# Patient Record
Sex: Female | Born: 1970 | Race: White | Hispanic: No | Marital: Married | Smoking: Never smoker
Health system: Southern US, Community
[De-identification: ages and names within clinical notes are randomized; demographics above are authoritative.]

## PROBLEM LIST (undated history)

## (undated) DIAGNOSIS — Z87442 Personal history of urinary calculi: Secondary | ICD-10-CM

## (undated) DIAGNOSIS — R519 Headache, unspecified: Secondary | ICD-10-CM

## (undated) DIAGNOSIS — R112 Nausea with vomiting, unspecified: Secondary | ICD-10-CM

## (undated) DIAGNOSIS — A630 Anogenital (venereal) warts: Secondary | ICD-10-CM

## (undated) DIAGNOSIS — F32A Depression, unspecified: Secondary | ICD-10-CM

## (undated) DIAGNOSIS — R51 Headache: Secondary | ICD-10-CM

## (undated) DIAGNOSIS — B019 Varicella without complication: Secondary | ICD-10-CM

## (undated) DIAGNOSIS — T753XXA Motion sickness, initial encounter: Secondary | ICD-10-CM

## (undated) DIAGNOSIS — Z9889 Other specified postprocedural states: Secondary | ICD-10-CM

## (undated) DIAGNOSIS — E785 Hyperlipidemia, unspecified: Secondary | ICD-10-CM

## (undated) DIAGNOSIS — F329 Major depressive disorder, single episode, unspecified: Secondary | ICD-10-CM

## (undated) DIAGNOSIS — I10 Essential (primary) hypertension: Secondary | ICD-10-CM

## (undated) HISTORY — DX: Varicella without complication: B01.9

## (undated) HISTORY — DX: Hyperlipidemia, unspecified: E78.5

## (undated) HISTORY — DX: Depression, unspecified: F32.A

## (undated) HISTORY — DX: Personal history of urinary calculi: Z87.442

## (undated) HISTORY — DX: Headache: R51

## (undated) HISTORY — DX: Major depressive disorder, single episode, unspecified: F32.9

## (undated) HISTORY — DX: Anogenital (venereal) warts: A63.0

## (undated) HISTORY — DX: Headache, unspecified: R51.9

---
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
## 2008-12-12 HISTORY — PX: HYSTEROSCOPY: SHX211

## 2008-12-12 HISTORY — PX: CYSTOSCOPY: SHX5120

## 2012-03-18 DIAGNOSIS — E559 Vitamin D deficiency, unspecified: Secondary | ICD-10-CM | POA: Insufficient documentation

## 2012-06-01 DIAGNOSIS — M67431 Ganglion, right wrist: Secondary | ICD-10-CM | POA: Insufficient documentation

## 2013-04-22 DIAGNOSIS — M25569 Pain in unspecified knee: Secondary | ICD-10-CM | POA: Insufficient documentation

## 2013-04-22 DIAGNOSIS — E669 Obesity, unspecified: Secondary | ICD-10-CM | POA: Insufficient documentation

## 2013-04-22 DIAGNOSIS — E66811 Obesity, class 1: Secondary | ICD-10-CM | POA: Insufficient documentation

## 2015-07-29 ENCOUNTER — Ambulatory Visit (INDEPENDENT_AMBULATORY_CARE_PROVIDER_SITE_OTHER): Payer: Managed Care, Other (non HMO) | Admitting: Internal Medicine

## 2015-07-29 ENCOUNTER — Encounter: Payer: Self-pay | Admitting: Internal Medicine

## 2015-07-29 VITALS — BP 124/90 | HR 73 | Temp 98.4°F | Ht 65.33 in | Wt 203.0 lb

## 2015-07-29 DIAGNOSIS — R519 Headache, unspecified: Secondary | ICD-10-CM | POA: Insufficient documentation

## 2015-07-29 DIAGNOSIS — R51 Headache: Secondary | ICD-10-CM

## 2015-07-29 DIAGNOSIS — F331 Major depressive disorder, recurrent, moderate: Secondary | ICD-10-CM | POA: Insufficient documentation

## 2015-07-29 DIAGNOSIS — E785 Hyperlipidemia, unspecified: Secondary | ICD-10-CM | POA: Diagnosis not present

## 2015-07-29 DIAGNOSIS — F329 Major depressive disorder, single episode, unspecified: Secondary | ICD-10-CM

## 2015-07-29 DIAGNOSIS — F32A Depression, unspecified: Secondary | ICD-10-CM

## 2015-07-29 DIAGNOSIS — F419 Anxiety disorder, unspecified: Secondary | ICD-10-CM

## 2015-07-29 NOTE — Progress Notes (Signed)
HPI  Pt presents to the clinic today to establish care and for management of the conditions listed below. She is transferring care from [facility].  Flu: 2015 Tetanus: > 10 years ago LMP: 07/10/15 Pap Smear: 08/2011 Mammogram: 12/10/2014 Vision Screening: yearly Dentist: biannually  Depression: This is a chronic issue for her. She takes Wellbutrin and Lexapro. She does have a lot of stress going on in her life right now. She denies SI/HI.  Frequent Headache: She thinks it may be sinus related. She is having headaches 5 x week right now. The occur behind her eyes and on her forehead. She describes the pain as squeezing and pressure. She does have some sensitivity to light but not sound. She denies nausea and vomiting. She gets good relief with Sudafed and Ibuprofen.  HLD: She reports she was on a statin but reports she can not rememer the name of it. She does not consume a low fat.  Past Medical History  Diagnosis Date  . Chicken pox   . Depression   . Frequent headaches   . Genital warts   . Hyperlipidemia   . History of kidney stones     Current Outpatient Prescriptions  Medication Sig Dispense Refill  . buPROPion (WELLBUTRIN XL) 150 MG 24 hr tablet Take 150 mg by mouth daily.     . Cholecalciferol (VITAMIN D3) 2000 UNITS TABS Take 1 tablet by mouth daily.    [name] Kitchen escitalopram (LEXAPRO) 20 MG tablet Take 20 mg by mouth daily.      No current facility-administered medications for this visit.    No Known Allergies  Family History  Problem Relation Age of Onset  . Hyperlipidemia Mother   . Heart disease Mother   . Hypertension Mother   . Alzheimer's disease Mother   . COPD Mother   . Hyperlipidemia Father   . Hypertension Father   . Breast cancer Paternal Aunt   . Arthritis Maternal Grandmother   . Mitochondrial disorder Brother     Mitochondrial encephalomyopathy lactic acidosis    Social History   Social History  . Marital Status: Married    Spouse Name:  N/A  . Number of Children: N/A  . Years of Education: N/A   Occupational History  . Not on file.   Social History Main Topics  . Smoking status: Never Smoker   . Smokeless tobacco: Never Used  . Alcohol Use: 0.0 oz/week    0 Standard drinks or equivalent per week     Comment: occasional  . Drug Use: No  . Sexual Activity: Not on file   Other Topics Concern  . Not on file   Social History Narrative  . No narrative on file    ROS:  Constitutional: Pt reports fatigue. Denies fever, malaise, headache or abrupt weight changes.  HEENT: Denies eye pain, eye redness, ear pain, ringing in the ears, wax buildup, runny nose, nasal congestion, bloody nose, or sore throat. Respiratory: Denies difficulty breathing, shortness of breath, cough or sputum production.   Cardiovascular: Denies chest pain, chest tightness, palpitations or swelling in the hands or feet.  Skin: Denies redness, rashes, lesions or ulcercations.  Neurological: Denies dizziness, difficulty with memory, difficulty with speech or problems with balance and coordination.  Psych: Pt reports depression. Denies anxiety SI/HI.  No other specific complaints in a complete review of systems (except as listed in HPI above).  PE:  BP 124/90 mmHg  Pulse 73  Temp(Src) 98.4 F (36.9 C) (Oral)  Ht  5' 5.33" (1.659 m)  Wt 203 lb (92.08 kg)  BMI 33.46 kg/m2  SpO2 98%  LMP 07/10/2015   Wt Readings from Last 3 Encounters:  07/29/15 203 lb (92.08 kg)    General: Appears her stated age, obese in NAD. HEENT: Head: normal shape and size; Eyes: sclera white, no icterus, conjunctiva pink, PERRLA and EOMs intact; Ears: Tm's gray and intact, normal light reflex; Nose: mucosa pink and moist, septum midline; Throat/Mouth: Teeth present, mucosa pink and moist, no lesions or ulcerations noted.  Cardiovascular: Normal rate and rhythm. S1,S2 noted.  No murmur, rubs or gallops noted. Pulmonary/Chest: Normal effort and positive vesicular  breath sounds. No respiratory distress. No wheezes, rales or ronchi noted.  Neurological: Alert and oriented.  Psychiatric: Affect flat. She is tearful at times. She does make eye contact and engage though.    Assessment and Plan:  Make an appt on your way out for an annual exam

## 2015-07-29 NOTE — Assessment & Plan Note (Signed)
We will check CMET and Lipid Profile with her physical exam next week Encouraged her to consume a low fat diet

## 2015-07-29 NOTE — Patient Instructions (Signed)

## 2015-07-29 NOTE — Progress Notes (Signed)
Pre visit review using our clinic review tool, if applicable. No additional management support is needed unless otherwise documented below in the visit note. 

## 2015-07-29 NOTE — Assessment & Plan Note (Signed)
Sinus related Discussed trying Zyrtec daily

## 2015-07-29 NOTE — Assessment & Plan Note (Signed)
Chronic but stable on Wellbutrin and Lexapro Discussed having her see a therapist and this is something she is interested in but she wants to check with her insurance company first.

## 2015-07-30 ENCOUNTER — Other Ambulatory Visit (INDEPENDENT_AMBULATORY_CARE_PROVIDER_SITE_OTHER): Payer: Managed Care, Other (non HMO)

## 2015-07-30 ENCOUNTER — Other Ambulatory Visit: Payer: Self-pay | Admitting: Internal Medicine

## 2015-07-30 DIAGNOSIS — R7989 Other specified abnormal findings of blood chemistry: Secondary | ICD-10-CM | POA: Diagnosis not present

## 2015-07-30 DIAGNOSIS — Z0000 Encounter for general adult medical examination without abnormal findings: Secondary | ICD-10-CM | POA: Diagnosis not present

## 2015-07-30 LAB — LIPID PANEL
CHOLESTEROL: 224 mg/dL — AB (ref 0–200)
HDL: 39 mg/dL — AB (ref >39.00)
NonHDL: 185.49
TRIGLYCERIDES: 239 mg/dL — AB (ref 0.0–149.0)
Total CHOL/HDL Ratio: 6
VLDL: 47.8 mg/dL — AB (ref 0.0–40.0)

## 2015-07-30 LAB — COMPREHENSIVE METABOLIC PANEL
ALBUMIN: 4.2 g/dL (ref 3.5–5.2)
ALK PHOS: 96 U/L (ref 39–117)
ALT: 15 U/L (ref 0–35)
AST: 15 U/L (ref 0–37)
BUN: 17 mg/dL (ref 6–23)
CO2: 29 mEq/L (ref 19–32)
Calcium: 9.2 mg/dL (ref 8.4–10.5)
Chloride: 104 mEq/L (ref 96–112)
Creatinine, Ser: 0.8 mg/dL (ref 0.40–1.20)
GFR: 82.5 mL/min (ref >60.00)
Glucose, Bld: 87 mg/dL (ref 70–99)
POTASSIUM: 4.5 meq/L (ref 3.5–5.1)
Sodium: 139 mEq/L (ref 135–145)
TOTAL PROTEIN: 6.8 g/dL (ref 6.0–8.3)
Total Bilirubin: 0.5 mg/dL (ref 0.2–1.2)

## 2015-07-30 LAB — CBC
HEMATOCRIT: 38.7 % (ref 36.0–46.0)
HEMOGLOBIN: 13.1 g/dL (ref 12.0–15.0)
MCHC: 33.7 g/dL (ref 30.0–36.0)
MCV: 94.3 fl (ref 78.0–100.0)
PLATELETS: 280 10*3/uL (ref 150.0–400.0)
RBC: 4.1 Mil/uL (ref 3.87–5.11)
RDW: 13.3 % (ref 11.5–15.5)
WBC: 7 10*3/uL (ref 4.0–10.5)

## 2015-07-30 LAB — LDL CHOLESTEROL, DIRECT: Direct LDL: 137 mg/dL

## 2015-07-30 LAB — HEMOGLOBIN A1C: HEMOGLOBIN A1C: 4.7 % (ref 4.6–6.5)

## 2015-08-05 ENCOUNTER — Encounter: Payer: Self-pay | Admitting: Internal Medicine

## 2015-08-05 ENCOUNTER — Ambulatory Visit (INDEPENDENT_AMBULATORY_CARE_PROVIDER_SITE_OTHER): Payer: Managed Care, Other (non HMO) | Admitting: Internal Medicine

## 2015-08-05 VITALS — BP 120/74 | HR 76 | Temp 98.2°F | Ht 65.33 in | Wt 201.0 lb

## 2015-08-05 DIAGNOSIS — Z0000 Encounter for general adult medical examination without abnormal findings: Secondary | ICD-10-CM | POA: Diagnosis not present

## 2015-08-05 DIAGNOSIS — Z111 Encounter for screening for respiratory tuberculosis: Secondary | ICD-10-CM

## 2015-08-05 NOTE — Addendum Note (Signed)
Addended by: Lurlean Nanny on: 08/05/2015 02:49 PM   Modules accepted: Orders

## 2015-08-05 NOTE — Patient Instructions (Signed)

## 2015-08-05 NOTE — Progress Notes (Signed)
Subjective:    Patient ID: Teresa Villarreal, female    DOB: 1971/09/11, 44 y.o.   MRN: [id number]  HPI  Pt presents to the clinic today for her annual exam.  Flu: 2015 Tetanus: 10/11/2011 LMP: 07/10/15 Pap Smear: 08/2011, needs one but on her menstrual cycle today Mammogram: 12/10/2014 Vision Screening: yearly Dentist: biannually  Diet: She tries to eat chocolate as much as possible. She does not adhere to any specific diet. Exercise: No exercise  Review of Systems      Past Medical History  Diagnosis Date  . Chicken pox   . Depression   . Frequent headaches   . Genital warts   . Hyperlipidemia   . History of kidney stones     Current Outpatient Prescriptions  Medication Sig Dispense Refill  . buPROPion (WELLBUTRIN XL) 150 MG 24 hr tablet Take 150 mg by mouth daily.     . Cholecalciferol (VITAMIN D3) 2000 UNITS TABS Take 1 tablet by mouth daily.    [name] Kitchen escitalopram (LEXAPRO) 20 MG tablet Take 20 mg by mouth daily.      No current facility-administered medications for this visit.    No Known Allergies  Family History  Problem Relation Age of Onset  . Hyperlipidemia Mother   . Heart disease Mother   . Hypertension Mother   . Alzheimer's disease Mother   . COPD Mother   . Hyperlipidemia Father   . Hypertension Father   . Breast cancer Paternal Aunt   . Arthritis Maternal Grandmother   . Mitochondrial disorder Brother     Mitochondrial encephalomyopathy lactic acidosis    Social History   Social History  . Marital Status: Married    Spouse Name: N/A  . Number of Children: N/A  . Years of Education: N/A   Occupational History  . Not on file.   Social History Main Topics  . Smoking status: Never Smoker   . Smokeless tobacco: Never Used  . Alcohol Use: 0.0 oz/week    0 Standard drinks or equivalent per week     Comment: occasional  . Drug Use: No  . Sexual Activity: Yes   Other Topics Concern  . Not on file   Social History Narrative  . No  narrative on file     Constitutional: Denies fever, malaise, fatigue, or abrupt weight changes.  HEENT: Denies eye pain, eye redness, ear pain, ringing in the ears, wax buildup, runny nose, nasal congestion, bloody nose, or sore throat. Respiratory: Denies difficulty breathing, shortness of breath, cough or sputum production.   Cardiovascular: Denies chest pain, chest tightness, palpitations or swelling in the hands or feet.  Gastrointestinal: Pt reports constipation. Denies abdominal pain, bloating, diarrhea or blood in the stool.  GU: Denies urgency, frequency, pain with urination, burning sensation, blood in urine, odor or discharge. Musculoskeletal: Denies decrease in range of motion, difficulty with gait, muscle pain or joint pain and swelling.  Skin: Denies redness, rashes, lesions or ulcercations.  Neurological: Denies dizziness, difficulty with memory, difficulty with speech or problems with balance and coordination.  Psych: Denies anxiety, depression, SI/HI.  No other specific complaints in a complete review of systems (except as listed in HPI above).  Objective:   Physical Exam  BP 120/74 mmHg  Pulse 76  Temp(Src) 98.2 F (36.8 C) (Oral)  Ht 5' 5.33" (1.659 m)  Wt 201 lb (91.173 kg)  BMI 33.13 kg/m2  SpO2 98%  LMP 08/02/2015 Wt Readings from Last 3 Encounters:  08/05/15 201 lb (  91.173 kg)  07/29/15 203 lb (92.08 kg)    General: Appears her stated age, obese in NAD. Skin: Warm, dry and intact. No rashes, lesions or ulcerations noted. HEENT: Head: normal shape and size; Eyes: sclera white, no icterus, conjunctiva pink, PERRLA and EOMs intact; Ears: Tm's gray and intact, normal light reflex; Throat/Mouth: Teeth present, mucosa pink and moist, no exudate, lesions or ulcerations noted.  Neck: Neck supple, trachea midline. No masses, lumps or thyromegaly present.  Cardiovascular: Normal rate and rhythm. S1,S2 noted.  No murmur, rubs or gallops noted. No JVD or BLE edema. No  carotid bruits noted. Pulmonary/Chest: Normal effort and positive vesicular breath sounds. No respiratory distress. No wheezes, rales or ronchi noted.  Abdomen: Soft and nontender. Normal bowel sounds, no bruits noted. No distention or masses noted. Liver, spleen and kidneys non palpable. Musculoskeletal: Normal range of motion. Strength 5/5 BUE/BLE. No difficulty with gait.  Neurological: Alert and oriented. Cranial nerves II-XII grossly intact. Coordination normal.  Psychiatric: Mood and affect normal. Behavior is normal. Judgment and thought content normal.     BMET    Component Value Date/Time   NA 139 07/30/2015 1516   K 4.5 07/30/2015 1516   CL 104 07/30/2015 1516   CO2 29 07/30/2015 1516   GLUCOSE 87 07/30/2015 1516   BUN 17 07/30/2015 1516   CREATININE 0.80 07/30/2015 1516   CALCIUM 9.2 07/30/2015 1516    Lipid Panel     Component Value Date/Time   CHOL 224* 07/30/2015 1516   TRIG 239.0* 07/30/2015 1516   HDL 39.00* 07/30/2015 1516   CHOLHDL 6 07/30/2015 1516   VLDL 47.8* 07/30/2015 1516    CBC    Component Value Date/Time   WBC 7.0 07/30/2015 1516   RBC 4.10 07/30/2015 1516   HGB 13.1 07/30/2015 1516   HCT 38.7 07/30/2015 1516   PLT 280.0 07/30/2015 1516   MCV 94.3 07/30/2015 1516   MCHC 33.7 07/30/2015 1516   RDW 13.3 07/30/2015 1516    Hgb A1C Lab Results  Component Value Date   HGBA1C 4.7 07/30/2015         Assessment & Plan:   Preventative Health Maintenance:  Encouraged her to work on diet and exercise Advised her to get a flu shot in the fall 2016 CBC, CMET, Lipid and A1C from 1 week ago reviewed- mildly elevated cholesterol- advised her to consume a low fat low cholesterol diet Advised her to make an appt for her pap smear  Mammogram is due 10/2015 Encouraged her to see an eye doctor and dentist at least annually TB test today Form filled out for employer certifying that she had her annual exam  RTC in 6 months to follow up chronic  conditions

## 2015-08-05 NOTE — Progress Notes (Signed)
Pre visit review using our clinic review tool, if applicable. No additional management support is needed unless otherwise documented below in the visit note. 

## 2015-08-07 LAB — TB SKIN TEST: TB Skin Test: NEGATIVE

## 2015-08-26 ENCOUNTER — Encounter: Payer: Self-pay | Admitting: Family Medicine

## 2015-08-26 ENCOUNTER — Ambulatory Visit (INDEPENDENT_AMBULATORY_CARE_PROVIDER_SITE_OTHER): Payer: Managed Care, Other (non HMO) | Admitting: Family Medicine

## 2015-08-26 VITALS — BP 146/84 | HR 84 | Temp 97.6°F | Ht 65.33 in | Wt 205.8 lb

## 2015-08-26 DIAGNOSIS — M7752 Other enthesopathy of left foot: Secondary | ICD-10-CM | POA: Diagnosis not present

## 2015-08-26 MED ORDER — BUPROPION HCL ER (XL) 150 MG PO TB24: 150.0000 mg | ORAL_TABLET | Freq: Every day | ORAL | Status: DC

## 2015-08-26 MED ORDER — DICLOFENAC SODIUM 75 MG PO TBEC: 75.0000 mg | DELAYED_RELEASE_TABLET | Freq: Two times a day (BID) | ORAL | Status: DC

## 2015-08-26 NOTE — Progress Notes (Signed)
Pre visit review using our clinic review tool, if applicable. No additional management support is needed unless otherwise documented below in the visit note. 

## 2015-08-26 NOTE — Patient Instructions (Signed)
5th MTP capsular injury

## 2015-08-26 NOTE — Progress Notes (Signed)
Dr. Frederico Hamman T. Mikinzie Maciejewski, MD, [facility] [place], [address] Phone: [phone] Fax: [phone]  08/26/2015  Patient: Teresa Villarreal, MRN: [id number], DOB: 10-Sep-1971, 44 y.o.  Primary Physician:  Webb Silversmith, NP  Chief Complaint: Foot Pain  Subjective:   Teresa Villarreal is a 44 y.o. very pleasant female patient who presents with the following:  Foot  Left foot, pinky toe does not bother her really at all. Maybe wearing flip flops  Lot.  Yesterday and now limping. He has been worsening over the last few weeks, the patient primarily has pain all the left side on the fifth digit in the MTP joint. Now her fifth toe is elevated during walking and she is having pain with walking. It is localized only to the MTP at the fifth.  There is no trauma, injury, redness or warmth associated with.   Past Medical History, Surgical History, Social History, Family History, Problem List, Medications, and Allergies have been reviewed and updated if relevant.  Patient Active Problem List   Diagnosis Date Noted  . Depression 07/29/2015  . Frequent headaches 07/29/2015  . HLD (hyperlipidemia) 07/29/2015    Past Medical History  Diagnosis Date  . Chicken pox   . Depression   . Frequent headaches   . Genital warts   . Hyperlipidemia   . History of kidney stones     Past Surgical History  Procedure Laterality Date  . Hysteroscopy  2010  . Cystoscopy  2010  . Knee arthroscopy Left 1990    Social History   Social History  . Marital Status: Married    Spouse Name: N/A  . Number of Children: N/A  . Years of Education: N/A   Occupational History  . Not on file.   Social History Main Topics  . Smoking status: Never Smoker   . Smokeless tobacco: Never Used  . Alcohol Use: 0.0 oz/week    0 Standard drinks or equivalent per week     Comment: occasional  . Drug Use: No  . Sexual Activity: Yes   Other Topics Concern  . Not  on file   Social History Narrative    Family History  Problem Relation Age of Onset  . Hyperlipidemia Mother   . Heart disease Mother   . Hypertension Mother   . Alzheimer's disease Mother   . COPD Mother   . Hyperlipidemia Father   . Hypertension Father   . Breast cancer Paternal Aunt   . Arthritis Maternal Grandmother   . Mitochondrial disorder Brother     Mitochondrial encephalomyopathy lactic acidosis    No Known Allergies  Medication list reviewed and updated in full in [place].  GEN: No fevers, chills. Nontoxic. Primarily MSK c/o today. MSK: Detailed in the HPI GI: tolerating PO intake without difficulty Neuro: No numbness, parasthesias, or tingling associated. Otherwise the pertinent positives of the ROS are noted above.   Objective:   BP 146/84 mmHg  Pulse 84  Temp(Src) 97.6 F (36.4 C) (Oral)  Ht 5' 5.33" (1.659 m)  Wt 205 lb 12 oz (93.328 kg)  BMI 33.91 kg/m2  LMP 08/02/2015   GEN: WDWN, NAD, Non-toxic, Alert & Oriented x 3 HEENT: Atraumatic, Normocephalic.  Ears and Nose: No external deformity. EXTR: No clubbing/cyanosis/edema NEURO: Normal gait.  PSYCH: Normally interactive. Conversant. Not depressed or anxious appearing.  Calm demeanor.   FEET: L Echymosis: no Edema: no ROM: full LE B Gait: heel toe,  non-antalgic MT pain: no Callus pattern: none Lateral Mall: NT Medial Mall: NT Talus: NT Navicular: NT Cuboid: NT Calcaneous: NT Metatarsals: NT 5th MT: NT at base, but distally at MTP, notable tenderness to palpation with MTP joint manipulation Phalanges: NT Achilles: NT Plantar Fascia: NT Fat Pad: NT Peroneals: NT Post Tib: NT Great Toe: Nml motion Ant Drawer: neg ATFL: NT CFL: NT Deltoid: NT Other foot breakdown: none Long arch: preserved Transverse arch: preserved Hindfoot breakdown: none Sensation: intact   Radiology: No results found.  Assessment and Plan:   Capsulitis of toe of left foot  5th MTP capsular  injury, likely overuse. Focal to this joint. I placed the patient in a lateral post of poron using a sports orthotic base. This seemed to make her more comfortable with walking. If this does not facilitate improvement, then transition to a stiff soled shoes such as a clog in the next 2-3 weeks.  Follow-up: prn  New Prescriptions   DICLOFENAC (VOLTAREN) 75 MG EC TABLET    Take 1 tablet (75 mg total) by mouth 2 (two) times daily.   Signed,  Maud Deed. Nazario Russom, MD   Patient's Medications  New Prescriptions   DICLOFENAC (VOLTAREN) 75 MG EC TABLET    Take 1 tablet (75 mg total) by mouth 2 (two) times daily.  Previous Medications   CHOLECALCIFEROL (VITAMIN D3) 2000 UNITS TABS    Take 1 tablet by mouth daily.   ESCITALOPRAM (LEXAPRO) 20 MG TABLET    Take 20 mg by mouth daily.   Modified Medications   Modified Medication Previous Medication   BUPROPION (WELLBUTRIN XL) 150 MG 24 HR TABLET buPROPion (WELLBUTRIN XL) 150 MG 24 hr tablet      Take 1 tablet (150 mg total) by mouth daily.    Take 150 mg by mouth daily.   Discontinued Medications   No medications on file

## 2015-12-03 ENCOUNTER — Other Ambulatory Visit: Payer: Self-pay

## 2015-12-03 MED ORDER — ESCITALOPRAM OXALATE 20 MG PO TABS: 20.0000 mg | ORAL_TABLET | Freq: Every day | ORAL | Status: DC

## 2015-12-03 NOTE — Telephone Encounter (Signed)
Pt request refill escitalopram to [facility]. Pt last seen 08/05/15 and refilled # 90 and pt will cb to schedule appt before med is gone per 08/05/15 visit wanting 6 month f/u.

## 2016-01-27 ENCOUNTER — Telehealth: Payer: Self-pay | Admitting: Internal Medicine

## 2016-01-27 ENCOUNTER — Ambulatory Visit (INDEPENDENT_AMBULATORY_CARE_PROVIDER_SITE_OTHER): Payer: Managed Care, Other (non HMO) | Admitting: Internal Medicine

## 2016-01-27 ENCOUNTER — Encounter: Payer: Self-pay | Admitting: Internal Medicine

## 2016-01-27 VITALS — BP 134/86 | HR 81 | Temp 98.6°F | Wt 199.0 lb

## 2016-01-27 DIAGNOSIS — J0100 Acute maxillary sinusitis, unspecified: Secondary | ICD-10-CM

## 2016-01-27 MED ORDER — PREDNISONE 10 MG PO TABS: ORAL_TABLET | ORAL | Status: DC

## 2016-01-27 NOTE — Telephone Encounter (Signed)
Appointment with Teresa Villarreal today at 1030.

## 2016-01-27 NOTE — Progress Notes (Signed)
HPI  Pt presents to the clinic today with c/o ongoing headache, dizziness, ear fullness, nasal congestion, facial pressure, sore throat and cough. Her symptoms started 2 weeks ago. She was seen at [facility] 1 week ago for the same. She was diagnosed with bilateral ear infections and a sinus infection. She was treated with Augmentin and Tessalon. She reports her symptoms have improved but have not completely resolved. Her nasal mucous has turned from green to clear. Her cough is non productive. She still has a lot of pressure in her face and her ears. She denies fever, chills or body aches, but has felt extremely fatigued. She has tried Sudafed, Tylenol, and Ibuprofen with some relief. She occasionally has seasonal allergies. She has not had sick contacts that she is aware of.  Review of Systems    Past Medical History  Diagnosis Date  . Chicken pox   . Depression   . Frequent headaches   . Genital warts   . Hyperlipidemia   . History of kidney stones     Family History  Problem Relation Age of Onset  . Hyperlipidemia Mother   . Heart disease Mother   . Hypertension Mother   . Alzheimer's disease Mother   . COPD Mother   . Hyperlipidemia Father   . Hypertension Father   . Breast cancer Paternal Aunt   . Arthritis Maternal Grandmother   . Mitochondrial disorder Brother     Mitochondrial encephalomyopathy lactic acidosis    Social History   Social History  . Marital Status: Married    Spouse Name: N/A  . Number of Children: N/A  . Years of Education: N/A   Occupational History  . Not on file.   Social History Main Topics  . Smoking status: Never Smoker   . Smokeless tobacco: Never Used  . Alcohol Use: 0.0 oz/week    0 Standard drinks or equivalent per week     Comment: occasional  . Drug Use: No  . Sexual Activity: Yes   Other Topics Concern  . Not on file   Social History Narrative    No Known Allergies   Constitutional: Positive headache, fatigue.  Denies fever or abrupt weight changes.  HEENT:  Positive facial pain, nasal congestion and sore throat. Denies eye redness, ear pain, ringing in the ears, wax buildup, runny nose or bloody nose. Respiratory: Positive cough. Denies difficulty breathing or shortness of breath.  Cardiovascular: Denies chest pain, chest tightness, palpitations or swelling in the hands or feet.   No other specific complaints in a complete review of systems (except as listed in HPI above).  Objective:  BP 134/86 mmHg  Pulse 81  Temp(Src) 98.6 F (37 C) (Oral)  Wt 199 lb (90.266 kg)  SpO2 98%  LMP 01/20/2016   General: Appears her stated age,  in NAD. HEENT: Head: normal shape and size, maxillary sinus tenderness noted; Eyes: sclera white, no icterus, conjunctiva pink; Ears: Tm's pink but intact, normal light reflex, + serous effusion bilaterally; Nose: mucosa boggy and moist, septum midline; Throat/Mouth: + PND. Teeth present, mucosa pink and moist, no exudate noted, no lesions or ulcerations noted.  Neck:  Cervical adenopathy noted bilaterally.  Cardiovascular: Normal rate and rhythm. S1,S2 noted.  No murmur, rubs or gallops noted. Pulmonary/Chest: Normal effort and positive vesicular breath sounds. No respiratory distress. No wheezes, rales or ronchi noted.      Assessment & Plan:   Acute maxillary sinusitis  Can use a Neti Pot which can be  purchased from your local drug store. Flonase 2 sprays each nostril for 3 days and then as needed. No indication for repeat antibiotics eRx for Pred Taper x 6 days  RTC as needed or if symptoms persist.

## 2016-01-27 NOTE — Patient Instructions (Signed)

## 2016-01-27 NOTE — Progress Notes (Signed)
Pre visit review using our clinic review tool, if applicable. No additional management support is needed unless otherwise documented below in the visit note. 

## 2016-01-27 NOTE — Telephone Encounter (Signed)
[facility] Patient Name: Teresa Villarreal DOB: 04/11/1971 Initial Comment Caller states has sinus and double ear infection, on abx, still having ear and sinus pain Nurse Assessment Nurse: Mechele Dawley, RN, Amy Date/Time (Eastern Time): 01/27/2016 9:19:36 AM Confirm and document reason for call. If symptomatic, describe symptoms. You must click the next button to save text entered. ---SHE WENT TO [facility] AND WAS DX WITH SINUS AND DOUBLE EAR INFECTION. SHE STILL HAS PRESSURE IN EAR AND PAIN, PRESSURE. SHE HAS BEEN TAKING PSEUDOPHED. SHE TOOK THE FULL COURSE OF THE AUGMENTIN. SHE IS STILL NOT FULLY BETTER. SHE IS HAVING EXTREME FATIGUE. FIRST SYMPTOMS STARTED ABOUT 2 WEEKS AGO. THEN SHE WENT INTO THE CLINIC A WEEK AGO. RIGHT EAR PAIN, SOME PAIN IN THE LEFT AS WELL, JUST NOT AS MUCH. Has the patient traveled out of the country within the last 30 days? ---Not Applicable Does the patient have any new or worsening symptoms? ---Yes Will a triage be completed? ---Yes Related visit to physician within the last 2 weeks? ---No Does the PT have any chronic conditions? (i.e. diabetes, asthma, etc.) ---No Is the patient pregnant or possibly pregnant? (Ask all females between the ages of 54-55) ---No Is this a behavioral health or substance abuse call? ---No Guidelines Guideline Title Affirmed Question Affirmed Notes Sinus Pain or Congestion [1] SEVERE pain AND [2] not improved 2 hours after pain medicine Final Disposition User See Physician within 4 Hours (or PCP triage) Mechele Dawley, RN, Amy Referrals REFERRED TO PCP OFFICE Disagree/Comply: Comply

## 2016-02-02 ENCOUNTER — Telehealth: Payer: Self-pay

## 2016-02-02 NOTE — Telephone Encounter (Signed)
Pt left v/m; pt was seen 01/27/16 and was given prednisone; pt finished prednisone; pt still has rt ear and sinus pressure; pt feels shaky all over her body; shakiness and weakness started today. Prednisone can cause weakness and shakiness. Pt has not had shakiness before and pt concerned about the shakiness. Pt request appt before lunch time tomorrow; pt scheduled with Dr Deborra Medina 02/03/16 at 12:45.

## 2016-02-03 ENCOUNTER — Encounter: Payer: Self-pay | Admitting: Family Medicine

## 2016-02-03 ENCOUNTER — Ambulatory Visit (INDEPENDENT_AMBULATORY_CARE_PROVIDER_SITE_OTHER): Payer: Managed Care, Other (non HMO) | Admitting: Family Medicine

## 2016-02-03 ENCOUNTER — Other Ambulatory Visit: Payer: Self-pay | Admitting: Internal Medicine

## 2016-02-03 VITALS — BP 128/78 | HR 108 | Temp 98.1°F | Wt 202.0 lb

## 2016-02-03 DIAGNOSIS — F411 Generalized anxiety disorder: Secondary | ICD-10-CM

## 2016-02-03 DIAGNOSIS — R251 Tremor, unspecified: Secondary | ICD-10-CM | POA: Diagnosis not present

## 2016-02-03 DIAGNOSIS — J019 Acute sinusitis, unspecified: Secondary | ICD-10-CM | POA: Insufficient documentation

## 2016-02-03 DIAGNOSIS — J0100 Acute maxillary sinusitis, unspecified: Secondary | ICD-10-CM

## 2016-02-03 DIAGNOSIS — J0191 Acute recurrent sinusitis, unspecified: Secondary | ICD-10-CM | POA: Insufficient documentation

## 2016-02-03 MED ORDER — ALPRAZOLAM 0.25 MG PO TABS: 0.2500 mg | ORAL_TABLET | Freq: Three times a day (TID) | ORAL | Status: DC | PRN

## 2016-02-03 NOTE — Assessment & Plan Note (Signed)
Exam reassuring and symptoms do seem to be resolving. No further rx indicated at this time.

## 2016-02-03 NOTE — Assessment & Plan Note (Signed)
INtermittent and likely multifactorial- ? Due to prednisone along with increased symptoms of anxiety that she has been feeling for months. See below.

## 2016-02-03 NOTE — Assessment & Plan Note (Signed)
Deteriorated. Continue current rxs, advised psychotherapy which she is considering Given rx for low dose xanax to use for panic attacks only. Discussed sedation and addiction potential. Follow up with PCP.

## 2016-02-03 NOTE — Progress Notes (Signed)
Pre visit review using our clinic review tool, if applicable. No additional management support is needed unless otherwise documented below in the visit note. 

## 2016-02-03 NOTE — Patient Instructions (Addendum)
Good to see you. Please follow up with [facility] when you can.  Try the xanax as needed for anxiety.  Please keep me updated.

## 2016-02-03 NOTE — Progress Notes (Signed)
Subjective:   Patient ID: Teresa Villarreal, female    DOB: November 13, 1971, 45 y.o.   MRN: [id number]  [name] is a pleasant 45 y.o. year old female pt of Webb Silversmith, who presents to clinic today with Ear Pain; Back Pain; and Nasal Congestion  on 02/03/2016  HPI:  She saw Teresa Villarreal on 01/27/16 for persistent symptoms of sinusitis.  Note reviewed.  Had facial pain and pressure for several weeks and had just finished Augmentin.  Still was experiencing bilateral ear pressure, fullness and intermittent dizziness.  Given eRx for prednisone taper.  Ms. Melms then called our office yesterday stating that she is still experiencing right ear and sinus pressure and more concerning, feeling "shaky all over."  She has finished course of prednisone.  She is tearful is anxious.  She is "in a funk."  Denies SI or HI.  Currently taking Lexpro 20 mg daily and Wellbutrin 150 mg XL daily. Current Outpatient Prescriptions on File Prior to Visit  Medication Sig Dispense Refill  . escitalopram (LEXAPRO) 20 MG tablet Take 1 tablet (20 mg total) by mouth daily. 90 tablet 0  . buPROPion (WELLBUTRIN XL) 150 MG 24 hr tablet Take 1 tablet (150 mg total) by mouth daily. 90 tablet 1   No current facility-administered medications on file prior to visit.    No Known Allergies  Past Medical History  Diagnosis Date  . Chicken pox   . Depression   . Frequent headaches   . Genital warts   . Hyperlipidemia   . History of kidney stones     Past Surgical History  Procedure Laterality Date  . Hysteroscopy  2010  . Cystoscopy  2010  . Knee arthroscopy Left 1990    Family History  Problem Relation Age of Onset  . Hyperlipidemia Mother   . Heart disease Mother   . Hypertension Mother   . Alzheimer's disease Mother   . COPD Mother   . Hyperlipidemia Father   . Hypertension Father   . Breast cancer Paternal Aunt   . Arthritis Maternal Grandmother   . Mitochondrial disorder Brother     Mitochondrial  encephalomyopathy lactic acidosis    Social History   Social History  . Marital Status: Married    Spouse Name: N/A  . Number of Children: N/A  . Years of Education: N/A   Occupational History  . Not on file.   Social History Main Topics  . Smoking status: Never Smoker   . Smokeless tobacco: Never Used  . Alcohol Use: 0.0 oz/week    0 Standard drinks or equivalent per week     Comment: occasional  . Drug Use: No  . Sexual Activity: Yes   Other Topics Concern  . Not on file   Social History Narrative   The PMH, PSH, Social History, Family History, Medications, and allergies have been reviewed in [facility], and have been updated if relevant.   Review of Systems  Constitutional: Negative.   HENT: Positive for congestion, ear pain and postnasal drip.   Respiratory: Negative.   Cardiovascular: Negative.   Psychiatric/Behavioral: Positive for decreased concentration. Negative for suicidal ideas, hallucinations, behavioral problems, confusion, sleep disturbance, self-injury and agitation. The patient is nervous/anxious. The patient is not hyperactive.   All other systems reviewed and are negative.      Objective:    BP 128/78 mmHg  Pulse 108  Temp(Src) 98.1 F (36.7 C) (Oral)  Wt 202 lb (91.627 kg)  SpO2 97%  LMP 01/20/2016  Physical Exam  Constitutional: She is oriented to person, place, and time. She appears well-developed and well-nourished. No distress.  HENT:  Head: Normocephalic and atraumatic.  Right Ear: External ear normal.  Left Ear: External ear normal.  Eyes: Conjunctivae are normal.  Neck: Normal range of motion.  Cardiovascular: Normal rate and regular rhythm.   Pulmonary/Chest: Effort normal and breath sounds normal.  Musculoskeletal: Normal range of motion.  Neurological: She is alert and oriented to person, place, and time. No cranial nerve deficit.  Skin: Skin is warm and dry. She is not diaphoretic.  Psychiatric:  Tearful, moderately anxious,  good eye contact and otherwise appropriate  Nursing note and vitals reviewed.         Assessment & Plan:   Acute maxillary sinusitis, recurrence not specified  Shakiness  Anxiety state No Follow-up on file.

## 2016-02-17 ENCOUNTER — Encounter: Payer: Self-pay | Admitting: Family Medicine

## 2016-02-17 ENCOUNTER — Telehealth: Payer: Self-pay | Admitting: Internal Medicine

## 2016-02-17 ENCOUNTER — Ambulatory Visit (INDEPENDENT_AMBULATORY_CARE_PROVIDER_SITE_OTHER): Payer: Managed Care, Other (non HMO) | Admitting: Family Medicine

## 2016-02-17 VITALS — BP 140/80 | HR 84 | Temp 98.2°F | Wt 201.8 lb

## 2016-02-17 DIAGNOSIS — J0191 Acute recurrent sinusitis, unspecified: Secondary | ICD-10-CM

## 2016-02-17 MED ORDER — LEVOFLOXACIN 500 MG PO TABS: 500.0000 mg | ORAL_TABLET | Freq: Every day | ORAL | Status: DC

## 2016-02-17 MED ORDER — FLUTICASONE PROPIONATE 50 MCG/ACT NA SUSP: 2.0000 | Freq: Every day | NASAL | Status: DC

## 2016-02-17 NOTE — Telephone Encounter (Signed)
Pt has appt with Dr Danise Mina on 02/17/16 at 10:30.

## 2016-02-17 NOTE — Progress Notes (Signed)
Pre visit review using our clinic review tool, if applicable. No additional management support is needed unless otherwise documented below in the visit note. 

## 2016-02-17 NOTE — Telephone Encounter (Signed)
[facility]  Patient Name: Teresa Villarreal  DOB: January 05, 1971    Initial Comment Caller States teeth, cheek, top of head pain pain, shooting ear pain-dull ache, making her neck very sore. all on the right side.    Nurse Assessment  Nurse: Arnetha Courser, RN, Susie Date/Time (Eastern Time): 02/17/2016 8:50:20 AM  Confirm and document reason for call. If symptomatic, describe symptoms. You must click the next button to save text entered. ---has pain in teeth, cheek, top of head and random shooting ear pain-dull ache, eye swelling and neck sore - all are present on the right; no fever, nausea, vomiting or diarrhea; sinus pressure has been over a week; double ear/sinus infection one week ago;  Has the patient traveled out of the country within the last 30 days? ---No  Does the patient have any new or worsening symptoms? ---Yes  Will a triage be completed? ---Yes  Related visit to physician within the last 2 weeks? ---Yes  Does the PT have any chronic conditions? (i.e. diabetes, asthma, etc.) ---No  Is the patient pregnant or possibly pregnant? (Ask all females between the ages of 22-55) ---No  Is this a behavioral health or substance abuse call? ---No     Guidelines    Guideline Title Affirmed Question Affirmed Notes  Face Pain [1] SEVERE pain (e.g., excruciating) AND [2] not improved after 2 hours of pain medicine    Final Disposition User   See Physician within 4 Hours (or PCP triage) Wisdom, RN, Susie    Referrals  REFERRED TO PCP OFFICE   Disagree/Comply: Comply

## 2016-02-17 NOTE — Progress Notes (Signed)
BP 140/80 mmHg  Pulse 84  Temp(Src) 98.2 F (36.8 C) (Oral)  Wt 201 lb 12 oz (91.513 kg)  LMP 02/14/2016   CC: facial pain  Subjective:    Patient ID: Teresa Villarreal, female    DOB: 05/02/1971, 45 y.o.   MRN: [id number]  HPI: Teresa Villarreal is a 45 y.o. female presenting on 02/17/2016 for Facial Pain   Recently seen by Teresa Villarreal then Dr Deborra Medina for acute maxillary sinusitis and double ear infection - seen initially at urgent care treated with augmentin antibiotic x10d, then prednisone course due to ongoing symptoms. Overall improving symptoms when last seen here 02/03/2016, no further treatment prescribed. For worsening anxiety, given short xanax 0.25mg  course for panic attacks #30.   Presents today with 1d h/o R shooting ear pain as well as R sided facial pressure for last 1 week. Actually doesn't feel R facial pressure ever fully went away. Endorses pain throughout entire R side of face and head. R eye feels different "more wet". Some R neck ache and shoulder pain. Also with R tooth ache and mild PNDrainage.  No fevers, coughing. No h/o sinus surgeries, allergic rhinitis or asthma. No vision changes.   She has also been taking pseudophed and ibuprofen which usually helps but not currently.  She has taken fluticasone nasal steroid in the past but didn't feel this really helped.  She tends to get 1 sinus infection a year.   Relevant past medical, surgical, family and social history reviewed and updated as indicated. Interim medical history since our last visit reviewed. Allergies and medications reviewed and updated. Current Outpatient Prescriptions on File Prior to Visit  Medication Sig  . ALPRAZolam (XANAX) 0.25 MG tablet Take 1 tablet (0.25 mg total) by mouth 3 (three) times daily as needed for anxiety.  [name] Kitchen buPROPion (WELLBUTRIN XL) 150 MG 24 hr tablet Take 1 tablet (150 mg total) by mouth daily. SCHEDULE FOLLOW UP OFFICE VISIT (Patient taking differently: Take 150 mg by mouth daily. )  .  escitalopram (LEXAPRO) 20 MG tablet Take 1 tablet (20 mg total) by mouth daily. SCHEDULE FOLLOW UP OFFICE VISIT (Patient taking differently: Take 20 mg by mouth daily. )   No current facility-administered medications on file prior to visit.    Review of Systems Per HPI unless specifically indicated in ROS section     Objective:    BP 140/80 mmHg  Pulse 84  Temp(Src) 98.2 F (36.8 C) (Oral)  Wt 201 lb 12 oz (91.513 kg)  LMP 02/14/2016  Wt Readings from Last 3 Encounters:  02/17/16 201 lb 12 oz (91.513 kg)  02/03/16 202 lb (91.627 kg)  01/27/16 199 lb (90.266 kg)    Physical Exam  Constitutional: She appears well-developed and well-nourished. No distress.  HENT:  Head: Normocephalic and atraumatic.  Right Ear: Hearing, tympanic membrane, external ear and ear canal normal.  Left Ear: Hearing, tympanic membrane, external ear and ear canal normal.  Nose: Mucosal edema present. No rhinorrhea. Right sinus exhibits maxillary sinus tenderness and frontal sinus tenderness. Left sinus exhibits no maxillary sinus tenderness and no frontal sinus tenderness.  Mouth/Throat: Uvula is midline, oropharynx is clear and moist and mucous membranes are normal. No oropharyngeal exudate, posterior oropharyngeal edema, posterior oropharyngeal erythema or tonsillar abscesses.  Nasal mucosal inflammation R>L  Eyes: Conjunctivae and EOM are normal. Pupils are equal, round, and reactive to light. No scleral icterus.  Neck: Normal range of motion. Neck supple.  Cardiovascular: Normal rate, regular rhythm, normal heart sounds and  intact distal pulses.   No murmur heard. Pulmonary/Chest: Effort normal and breath sounds normal. No respiratory distress. She has no wheezes. She has no rales.  Lymphadenopathy:    She has no cervical adenopathy.  Skin: Skin is warm and dry. No rash noted.  Nursing note and vitals reviewed.     Assessment & Plan:   Problem List Items Addressed This Visit    Recurrent acute  sinusitis - Primary    Anticipate recurrent sinusitis. Given recent abx course will broaden abx to levaquin 500mg  10d course. Start flonase, add ibuprofen. Update if not improving with treatment to consider ENT referral. Pt agrees with plan.       Relevant Medications   fluticasone (FLONASE) 50 MCG/ACT nasal spray   levofloxacin (LEVAQUIN) 500 MG tablet       Follow up plan: Return if symptoms worsen or fail to improve.

## 2016-02-17 NOTE — Assessment & Plan Note (Signed)
Anticipate recurrent sinusitis. Given recent abx course will broaden abx to levaquin 500mg  10d course. Start flonase, add ibuprofen. Update if not improving with treatment to consider ENT referral. Pt agrees with plan.

## 2016-02-17 NOTE — Patient Instructions (Signed)
I think this is recurrent sinus inflammation/infection. Take medicine as prescribed: flonase daily, levaquin course, ibuprofen 600mg  twice daily with meal. Push fluids and plenty of rest. If not better with this, let us know for ENT referral Please let us know if fever >101.5, trouble opening/closing mouth, difficulty swallowing, or worsening instead of improving as expected.

## 2016-02-25 ENCOUNTER — Other Ambulatory Visit: Payer: Self-pay

## 2016-02-25 MED ORDER — BUPROPION HCL ER (XL) 150 MG PO TB24: 150.0000 mg | ORAL_TABLET | Freq: Every day | ORAL | Status: DC

## 2016-02-25 NOTE — Telephone Encounter (Signed)
Looks like when she started the medication 02-03-16, she was to make a followup OV. No OV scheduled

## 2016-02-25 NOTE — Telephone Encounter (Signed)
Should be fine to refill until 07/2016, sent electronically

## 2016-03-01 ENCOUNTER — Other Ambulatory Visit: Payer: Self-pay | Admitting: *Deleted

## 2016-03-01 MED ORDER — ESCITALOPRAM OXALATE 20 MG PO TABS: 20.0000 mg | ORAL_TABLET | Freq: Every day | ORAL | Status: DC

## 2016-03-10 ENCOUNTER — Other Ambulatory Visit: Payer: Self-pay | Admitting: *Deleted

## 2016-03-10 MED ORDER — BUPROPION HCL ER (XL) 150 MG PO TB24: 150.0000 mg | ORAL_TABLET | Freq: Every day | ORAL | Status: DC

## 2016-03-10 MED ORDER — ESCITALOPRAM OXALATE 20 MG PO TABS: 20.0000 mg | ORAL_TABLET | Freq: Every day | ORAL | Status: DC

## 2016-04-22 ENCOUNTER — Telehealth: Payer: Self-pay | Admitting: Internal Medicine
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
## 2016-04-22 NOTE — Telephone Encounter (Signed)
I would recommend UC at the very least if no appt available

## 2016-05-06 ENCOUNTER — Telehealth: Payer: Self-pay | Admitting: Internal Medicine

## 2016-05-06 ENCOUNTER — Ambulatory Visit (INDEPENDENT_AMBULATORY_CARE_PROVIDER_SITE_OTHER): Payer: Managed Care, Other (non HMO) | Admitting: Family Medicine

## 2016-05-06 ENCOUNTER — Encounter: Payer: Self-pay | Admitting: Family Medicine

## 2016-05-06 VITALS — BP 120/78 | HR 78 | Temp 98.3°F | Wt 203.0 lb

## 2016-05-06 DIAGNOSIS — R51 Headache: Secondary | ICD-10-CM | POA: Diagnosis not present

## 2016-05-06 DIAGNOSIS — R519 Headache, unspecified: Secondary | ICD-10-CM

## 2016-05-06 MED ORDER — CYCLOBENZAPRINE HCL 10 MG PO TABS: 5.0000 mg | ORAL_TABLET | Freq: Three times a day (TID) | ORAL | Status: DC | PRN

## 2016-05-06 NOTE — Progress Notes (Signed)
Pre visit review using our clinic review tool, if applicable. No additional management support is needed unless otherwise documented below in the visit note.  Sx started yesterday.  Pain on the top of the head.  This was different from prev sinus pain but does have some maxillary sinus pressure.  R side of neck is sore, R>L.  No other aches.  Some nausea.  Photophobia.  No dx of migraines prev.  Feels weak in general, no focally weak.  No fevers, chills, diarrhea.  Menses currently, which could explain some of the nausea.  Still able to eat.  No injury, no trauma.  Daughter with strep last week but no ST.  She had some dental work done, crown, on L upper molar, that feels okay per patient.  No cough.  She usually takes pseudophen and ibuprofen for sinus HA, tried that w/o relief.  Heating pad helped her neck some.    Meds, vitals, and allergies reviewed.   ROS: Per HPI unless specifically indicated in ROS section   GEN: nad, alert and oriented HEENT: mucous membranes moist, tm wnl OP wnl, sinuses not ttp NECK: supple w/o LA but discomfort with looking down or to the L; R trap ttp.  No midline neck pain.  CV: rrr. PULM: ctab, no inc wob ABD: soft, +bs No rash.  CN 2-12 wnl B, S/S/DTR wnl x4

## 2016-05-06 NOTE — Patient Instructions (Signed)
Try taking flexeril and use heat on your neck.  This should gradually improve.   Update [place] as needed.

## 2016-05-06 NOTE — Telephone Encounter (Signed)
[facility] Patient Name: Teresa Villarreal DOB: 1971-09-23 Initial Comment Caller states she has had a headache for the last two days. Ear pain, neck and shoulder pain. Nausea. Nurse Assessment Nurse: Dimas Chyle, RN, Dellis Filbert Date/Time Eilene Ghazi Time): 05/06/2016 8:25:49 AM Confirm and document reason for call. If symptomatic, describe symptoms. You must click the next button to save text entered. ---Caller states she has had a headache for the last two days. Ear pain, neck and shoulder pain. Nausea. No fever. Has the patient traveled out of the country within the last 30 days? ---No Does the patient have any new or worsening symptoms? ---Yes Will a triage be completed? ---Yes Related visit to physician within the last 2 weeks? ---No Does the PT have any chronic conditions? (i.e. diabetes, asthma, etc.) ---No Is the patient pregnant or possibly pregnant? (Ask all females between the ages of 64-55) ---No Is this a behavioral health or substance abuse call? ---No Guidelines Guideline Title Affirmed Question Affirmed Notes Sinus Pain or Congestion Earache Final Disposition User See Physician within 24 Hours Dimas Chyle, RN, [employer] Referrals REFERRED TO PCP OFFICE Disagree/Comply: Leta Baptist

## 2016-05-06 NOTE — Telephone Encounter (Signed)
Pt has appt with Dr Damita Dunnings 09/06/16 at 9:15.

## 2016-05-09 DIAGNOSIS — R51 Headache: Secondary | ICD-10-CM

## 2016-05-09 DIAGNOSIS — R519 Headache, unspecified: Secondary | ICD-10-CM | POA: Insufficient documentation

## 2016-05-09 NOTE — Assessment & Plan Note (Signed)
Likely with MSK source- trap strain, occipital muscle irritation causing occipital pain up the scalp.   Nontoxic.  Anatomy d/w pt.  Can try taking flexeril and use heat on the neck.  This should gradually improve.  Update [place] as needed.   Routed to PCP as FYI.

## 2016-06-27 ENCOUNTER — Telehealth: Payer: Self-pay

## 2016-06-27 NOTE — Telephone Encounter (Signed)
PLEASE NOTE: All timestamps contained within this report are represented as [place] Standard Time. CONFIDENTIALTY NOTICE: This fax transmission is intended only for the addressee. It contains information that is legally privileged, confidential or otherwise protected from use or disclosure. If you are not the intended recipient, you are strictly prohibited from reviewing, disclosing, copying using or disseminating any of this information or taking any action in reliance on or regarding this information. If you have received this fax in error, please notify us immediately by telephone so that we can arrange for its return to [place]. Phone: [phone], Toll-Free: [phone], Fax: [phone] Page: 1 of 2 Call Id: [id number] [place] Patient Name: Teresa Villarreal Gender: Female DOB: 20-Feb-1971 Age: 45 Y 70 M 27 D Return Phone Number: [phone] (Primary) Address: City/State/Zip: [place] Night - Client Client Site [place] Physician Webb Silversmith - NP Contact Type Call Who Is Calling Patient / Member / Family / Caregiver Call Type Triage / Clinical Relationship To Patient Self Return Phone Number [phone] (Primary) Chief Complaint Urination Pain Reason for Call Symptomatic / Request for Health Information Initial Comment She is having symptoms of a UTI and needs to know what she can take. PreDisposition Go to Urgent Care/Walk-In Clinic Translation No Nurse Assessment Nurse: Venetia Maxon, RN, Manuela Schwartz Date/Time (Eastern Time): 06/25/2016 2:32:23 PM Confirm and document reason for call. If symptomatic, describe symptoms. You must click the next button to save text entered. ---She is having symptoms of a UTI and needs to know what she can take. burning and blood in urine for past week and 1/2 . no fever . nausea . and she denies  low back pain. Has the patient traveled out of the country within the last 30 days? ---No Does the patient have any new or worsening symptoms? ---Yes Will a triage be completed? ---Yes Related visit to physician within the last 2 weeks? ---No Does the PT have any chronic conditions? (i.e. diabetes, asthma, etc.) ---Yes List chronic conditions. ---hx of kidney stone last 10 yrs ago Is the patient pregnant or possibly pregnant? (Ask all females between the ages of 6-55) ---No Is this a behavioral health or substance abuse call? ---No Guidelines Guideline Title Affirmed Question Affirmed Notes Nurse Date/Time (Eastern Time) Urination Pain - Female [1] SEVERE pain with urination (e.g., excruciating) AND [2] not improved after 2 hours of pain medicine and Sitz bath Venetia Maxon, RN, Manuela Schwartz 06/25/2016 2:33:40 PM Disp. Time Eilene Ghazi Time) Disposition Final User PLEASE NOTE: All timestamps contained within this report are represented as [place] Standard Time. CONFIDENTIALTY NOTICE: This fax transmission is intended only for the addressee. It contains information that is legally privileged, confidential or otherwise protected from use or disclosure. If you are not the intended recipient, you are strictly prohibited from reviewing, disclosing, copying using or disseminating any of this information or taking any action in reliance on or regarding this information. If you have received this fax in error, please notify us immediately by telephone so that we can arrange for its return to [place]. Phone: [phone], Toll-Free: [phone], Fax: [phone] Page: 2 of 2 Call Id: [id number] 06/25/2016 2:36:17 PM See Physician within 4 Hours (or PCP triage) Yes Venetia Maxon, RN, Edwena Bunde Understands: Yes Disagree/Comply: Comply Care Advice Given Per Guideline SEE PHYSICIAN WITHIN 4 HOURS (or PCP triage): CALL BACK IF: * You become worse. CARE ADVICE given per Urination Pain -  Female (Adult)  guideline. Referrals GO TO FACILITY OTHER - SPECIFY

## 2016-06-27 NOTE — Telephone Encounter (Signed)
noted 

## 2016-06-27 NOTE — Telephone Encounter (Signed)
I spoke with pt and she did go to [facility] in [place] and is feeling better.pt is to find out culture results on 06/28/16.

## 2016-06-28 NOTE — Telephone Encounter (Addendum)
Pt got urine culture report from UC; culture was clear and no bacteria and was told to stop taking abx. Pt still does not feel right; the burning and pain upon urination is gone. Pt has hx of kidney stones and wonders if culture was OK why did she have blood in urine. Pt already has appt with Dr Deborra Medina on 03/30/16 at 12:45. Pt will keep appt 03/30/16 and cb prior to appt if needed. FYI to Dr Deborra Medina.

## 2016-06-29 ENCOUNTER — Ambulatory Visit (INDEPENDENT_AMBULATORY_CARE_PROVIDER_SITE_OTHER): Payer: Managed Care, Other (non HMO) | Admitting: Family Medicine

## 2016-06-29 ENCOUNTER — Other Ambulatory Visit: Payer: Managed Care, Other (non HMO)

## 2016-06-29 ENCOUNTER — Encounter: Payer: Self-pay | Admitting: Family Medicine

## 2016-06-29 VITALS — BP 132/88 | HR 75 | Temp 97.7°F | Wt 201.5 lb

## 2016-06-29 DIAGNOSIS — R300 Dysuria: Secondary | ICD-10-CM | POA: Diagnosis not present

## 2016-06-29 DIAGNOSIS — R1032 Left lower quadrant pain: Secondary | ICD-10-CM | POA: Diagnosis not present

## 2016-06-29 LAB — POC URINALSYSI DIPSTICK (AUTOMATED)
BILIRUBIN UA: NEGATIVE
Blood, UA: NEGATIVE
GLUCOSE UA: NEGATIVE
KETONES UA: NEGATIVE
Leukocytes, UA: NEGATIVE
Nitrite, UA: NEGATIVE
Protein, UA: NEGATIVE
SPEC GRAV UA: 1.015
Urobilinogen, UA: 0.2
pH, UA: 6

## 2016-06-29 NOTE — Assessment & Plan Note (Signed)
With tenderness on exam. UA negative. ? If she already passed a stone. Will get Renal CT. The patient indicates understanding of these issues and agrees with the plan.

## 2016-06-29 NOTE — Patient Instructions (Signed)
Great to see you. Please stop by to see Teresa Villarreal on your way out.   

## 2016-06-29 NOTE — Progress Notes (Signed)
Subjective:   Patient ID: Teresa Villarreal, female    DOB: 10/24/1971, 45 y.o.   MRN: [id number]  Teresa Villarreal is a pleasant 45 y.o. year old female pt of [facility], who presents to clinic today with Follow-up  on 06/29/2016  HPI:  Was seen at [facility] this week for one week of progressive dysuria, LLQ pain and hematuria.  Had been taking AZO at the time which was helping with dysuria but which made UA inconclusive. Started on Macrobid and urine cx was sent.  Per pt, symptoms improved after 2 doses of antibiotics but was advised to stop taking it.  She feels a little better today but still has tenderness.  Gross hematuria has resolved.  Dysuria is better.  Current Outpatient Prescriptions on File Prior to Visit  Medication Sig Dispense Refill  . ALPRAZolam (XANAX) 0.25 MG tablet Take 1 tablet (0.25 mg total) by mouth 3 (three) times daily as needed for anxiety. 30 tablet 0  . buPROPion (WELLBUTRIN XL) 150 MG 24 hr tablet Take 1 tablet (150 mg total) by mouth daily. 90 tablet 1  . cyclobenzaprine (FLEXERIL) 10 MG tablet Take 0.5-1 tablets (5-10 mg total) by mouth 3 (three) times daily as needed for muscle spasms (sedation caution). 30 tablet 0  . escitalopram (LEXAPRO) 20 MG tablet Take 1 tablet (20 mg total) by mouth daily. 90 tablet 1  . fluticasone (FLONASE) 50 MCG/ACT nasal spray Place 2 sprays into both nostrils daily. 16 g 6   No current facility-administered medications on file prior to visit.    No Known Allergies  Past Medical History  Diagnosis Date  . Chicken pox   . Depression   . Frequent headaches   . Genital warts   . Hyperlipidemia   . History of kidney stones     Past Surgical History  Procedure Laterality Date  . Hysteroscopy  2010  . Cystoscopy  2010  . Knee arthroscopy Left 1990    Family History  Problem Relation Age of Onset  . Hyperlipidemia Mother   . Heart disease Mother   . Hypertension Mother   . Alzheimer's disease Mother   . COPD Mother     . Hyperlipidemia Father   . Hypertension Father   . Breast cancer Paternal Aunt   . Arthritis Maternal Grandmother   . Mitochondrial disorder Brother     Mitochondrial encephalomyopathy lactic acidosis    Social History   Social History  . Marital Status: Married    Spouse Name: N/A  . Number of Children: N/A  . Years of Education: N/A   Occupational History  . Not on file.   Social History Main Topics  . Smoking status: Never Smoker   . Smokeless tobacco: Never Used  . Alcohol Use: 0.0 oz/week    0 Standard drinks or equivalent per week     Comment: occasional  . Drug Use: No  . Sexual Activity: Yes   Other Topics Concern  . Not on file   Social History Narrative   The PMH, PSH, Social History, Family History, Medications, and allergies have been reviewed in [facility], and have been updated if relevant.   Review of Systems  Constitutional: Positive for chills and fatigue. Negative for fever.  Gastrointestinal: Positive for nausea and abdominal pain. Negative for vomiting.  Genitourinary: Positive for dysuria, frequency and hematuria. Negative for vaginal bleeding, vaginal discharge and vaginal pain.  All other systems reviewed and are negative.      Objective:  BP 132/88 mmHg  Pulse 75  Temp(Src) 97.7 F (36.5 C) (Oral)  Wt 201 lb 8 oz (91.4 kg)  LMP 06/02/2016   Physical Exam  Constitutional: She is oriented to person, place, and time. She appears well-developed and well-nourished. No distress.  HENT:  Head: Normocephalic.  Eyes: Conjunctivae are normal.  Cardiovascular: Normal rate.   Pulmonary/Chest: Effort normal.  Abdominal: Soft. She exhibits no mass. There is tenderness. There is no rebound and no guarding.  Musculoskeletal: Normal range of motion.  Neurological: She is alert and oriented to person, place, and time. No cranial nerve deficit.  Skin: Skin is dry. She is not diaphoretic.  Psychiatric: She has a normal mood and affect. Her behavior  is normal. Judgment and thought content normal.  Nursing note and vitals reviewed.         Assessment & Plan:   Dysuria - Plan: POCT Urinalysis Dipstick (Automated), CT Abdomen Pelvis Wo Contrast  LLQ pain - Plan: CT Abdomen Pelvis Wo Contrast No Follow-up on file.

## 2016-06-29 NOTE — Progress Notes (Signed)
Pre visit review using our clinic review tool, if applicable. No additional management support is needed unless otherwise documented below in the visit note. 

## 2016-06-30 ENCOUNTER — Ambulatory Visit (INDEPENDENT_AMBULATORY_CARE_PROVIDER_SITE_OTHER)
Admission: RE | Admit: 2016-06-30 | Discharge: 2016-06-30 | Disposition: A | Discharge status: Home / Self Care | Payer: Managed Care, Other (non HMO) | Source: Ambulatory Visit | Attending: Family Medicine | Admitting: Family Medicine

## 2016-06-30 DIAGNOSIS — R1032 Left lower quadrant pain: Secondary | ICD-10-CM

## 2016-06-30 DIAGNOSIS — R300 Dysuria: Secondary | ICD-10-CM | POA: Diagnosis not present

## 2016-07-22 ENCOUNTER — Encounter: Payer: Self-pay | Admitting: Internal Medicine

## 2016-07-22 ENCOUNTER — Ambulatory Visit (INDEPENDENT_AMBULATORY_CARE_PROVIDER_SITE_OTHER): Payer: Managed Care, Other (non HMO) | Admitting: Internal Medicine

## 2016-07-22 VITALS — BP 136/82 | HR 94 | Temp 98.6°F | Wt 201.0 lb

## 2016-07-22 DIAGNOSIS — R5383 Other fatigue: Secondary | ICD-10-CM

## 2016-07-22 DIAGNOSIS — R42 Dizziness and giddiness: Secondary | ICD-10-CM

## 2016-07-22 DIAGNOSIS — R110 Nausea: Secondary | ICD-10-CM

## 2016-07-22 DIAGNOSIS — R51 Headache: Secondary | ICD-10-CM

## 2016-07-22 DIAGNOSIS — D509 Iron deficiency anemia, unspecified: Secondary | ICD-10-CM | POA: Diagnosis not present

## 2016-07-22 DIAGNOSIS — R519 Headache, unspecified: Secondary | ICD-10-CM

## 2016-07-22 DIAGNOSIS — E611 Iron deficiency: Secondary | ICD-10-CM

## 2016-07-22 LAB — CBC WITH DIFFERENTIAL/PLATELET
BASOS ABS: 67 {cells}/uL (ref 0–200)
BASOS PCT: 1 %
EOS ABS: 134 {cells}/uL (ref 15–500)
Eosinophils Relative: 2 %
HCT: 34.4 % — ABNORMAL LOW (ref 35.0–45.0)
HEMOGLOBIN: 11.2 g/dL — AB (ref 11.7–15.5)
LYMPHS ABS: 1876 {cells}/uL (ref 850–3900)
Lymphocytes Relative: 28 %
MCH: 28.3 pg (ref 27.0–33.0)
MCHC: 32.6 g/dL (ref 32.0–36.0)
MCV: 86.9 fL (ref 80.0–100.0)
MPV: 9.5 fL (ref 7.5–12.5)
Monocytes Absolute: 536 cells/uL (ref 200–950)
Monocytes Relative: 8 %
NEUTROS ABS: 4087 {cells}/uL (ref 1500–7800)
NEUTROS PCT: 61 %
Platelets: 337 10*3/uL (ref 140–400)
RBC: 3.96 MIL/uL (ref 3.80–5.10)
RDW: 14.6 % (ref 11.0–15.0)
WBC: 6.7 10*3/uL (ref 3.8–10.8)

## 2016-07-22 LAB — FERRITIN: FERRITIN: 8 ng/mL — AB (ref 10–232)

## 2016-07-22 LAB — TSH: TSH: 1.15 m[IU]/L

## 2016-07-22 NOTE — Progress Notes (Signed)
Subjective:    Patient ID: Teresa Villarreal, female    DOB: 1971/10/21, 45 y.o.   MRN: [id number]  HPI  Pt presents to the clinic today after trying to donate blood 4 days ago and being told her iron was low at 10.4.  She reports she has donated iron before a couple of months ago and her iron level was not low at that time.  She denies easy bruising, epistaxis, or bleeding from the gums or rectum.  She also complains of intermittent fatigue x 2-3 months.  She reports the fatigue does not occur daily, but occurs more often than not.  She states she has been caring for her parents and her 3 children, and thought this may have been causing or contributing to the fatigue.  She also reports of intermittent dizziness x 2 weeks.  She reports the dizziness lasts a few seconds at a time, and admits to associated blurred vision and nausea.  She denies loss of consciousness or vomiting.  She also complains of headaches, which she states have been a chronic issue for her but have increased in frequency over the past 2 months.  She reports the headaches occur daily across her forehead and across both cheeks with radiation to the ears, and over the past 2 months have started to occur across the top of her head as well.  She describes the pain as achy/burning/pressure at a severity of 6-7/10.  She has tried Ibuprofen and Sudafed with relief.  She also reports increased heart rate, but states this rarely occurs and usually only happens when she is feeling anxious.  She denies chest pain or shortness of breath.  She denies fevers, chills, abdominal pain, or numbness or tingling in her extremities.  She admits to constipation, but states this has been a problem for a while.        Review of Systems Past Medical History:  Diagnosis Date  . Chicken pox   . Depression   . Frequent headaches   . Genital warts   . History of kidney stones   . Hyperlipidemia    Past Surgical History:  Procedure Laterality Date  .  CYSTOSCOPY  2010  . HYSTEROSCOPY  2010  . KNEE ARTHROSCOPY Left 1990   Family History  Problem Relation Age of Onset  . Hyperlipidemia Mother   . Heart disease Mother   . Hypertension Mother   . Alzheimer's disease Mother   . COPD Mother   . Hyperlipidemia Father   . Hypertension Father   . Mitochondrial disorder Brother     Mitochondrial encephalomyopathy lactic acidosis  . Breast cancer Paternal Aunt   . Arthritis Maternal Grandmother    Current Outpatient Prescriptions on File Prior to Visit  Medication Sig Dispense Refill  . ALPRAZolam (XANAX) 0.25 MG tablet Take 1 tablet (0.25 mg total) by mouth 3 (three) times daily as needed for anxiety. 30 tablet 0  . buPROPion (WELLBUTRIN XL) 150 MG 24 hr tablet Take 1 tablet (150 mg total) by mouth daily. 90 tablet 1  . cyclobenzaprine (FLEXERIL) 10 MG tablet Take 0.5-1 tablets (5-10 mg total) by mouth 3 (three) times daily as needed for muscle spasms (sedation caution). 30 tablet 0  . escitalopram (LEXAPRO) 20 MG tablet Take 1 tablet (20 mg total) by mouth daily. 90 tablet 1  . fluticasone (FLONASE) 50 MCG/ACT nasal spray Place 2 sprays into both nostrils daily. 16 g 6  . ibuprofen (ADVIL,MOTRIN) 200 MG tablet Take 200 mg by  mouth as needed.    . pseudoephedrine (SUDAFED) 30 MG tablet Take 30 mg by mouth as needed for congestion.     No current facility-administered medications on file prior to visit.    No Known Allergies  Const: Pt reports fatigue, headaches, and dizziness. Denies fevers, chills, or loss of consciousness. HEENT: Pt reports blurred vision. Pulm: Denies shortness of breath. CV: Pt reports tachycardia.  Denies chest pain. GI: Pt reports nausea and constipation.  Denies vomiting, abdominal pain, or diarrhea. Neuro: Denies numbness or tingling.      Objective:   Physical Exam  BP 136/82   Pulse 94   Temp 98.6 F (37 C) (Oral)   Wt 201 lb (91.2 kg)   LMP 06/11/2016   SpO2 97%   BMI 33.11 kg/m   General:   Well-appearing, in no acute distress. HEENT: No scleral icterus or conjunctival injection.  EOMs intact.  TMs pearly grey.  No erythema or discharge of nose.  No erythema or exudates of pharynx.  Frontal and maxillary sinuses tender to palpation. Pulm:  Clear to auscultation bilaterally. No wheezes, rales, or rhonchi.  Capillary refill 2-3 seconds.  No cyanosis noted. CV:  Regular rate and rhythm.  No murmurs, rubs, or gallops. GI:  Positive bowel sounds all four quadrants.  Soft, nontender to palpation. Neuro: Cranial nerves intact.  Able to complete finger-to-nose test.  Point localization intact.  Negative Romberg.  Negative Pronator Drift.  Gait normal.        Assessment & Plan:   Fatigue, low iron, dizziness, nausea:  CBC, Ferritin, Iron panel, and TSH today Will follow-up based on lab results Call if symptoms worsen  Frequent headaches:  Continue Ibuprofen as needed Stop Sudafed Try Flonase, Zyrtec daily  Call if symptoms worsen or do not resolve Kadarius Cuffe, NP

## 2016-07-22 NOTE — Patient Instructions (Signed)
Iron Deficiency Anemia, Adult Anemia is a condition in which there are less red blood cells or hemoglobin in the blood than normal. Hemoglobin is the part of red blood cells that carries oxygen. Iron deficiency anemia is anemia caused by too little iron. It is the most common type of anemia. It may leave you tired and short of breath. CAUSES   Lack of iron in the diet.  Poor absorption of iron, as seen with intestinal disorders.  Intestinal bleeding.  Heavy periods. SIGNS AND SYMPTOMS  Mild anemia may not be noticeable. Symptoms may include:  Fatigue.  Headache.  Pale skin.  Weakness.  Tiredness.  Shortness of breath.  Dizziness.  Cold hands and feet.  Fast or irregular heartbeat. DIAGNOSIS  Diagnosis requires a thorough evaluation and physical exam by your health care provider. Blood tests are generally used to confirm iron deficiency anemia. Additional tests may be done to find the underlying cause of your anemia. These may include:  Testing for blood in the stool (fecal occult blood test).  A procedure to see inside the colon and rectum (colonoscopy).  A procedure to see inside the esophagus and stomach (endoscopy). TREATMENT  Iron deficiency anemia is treated by correcting the cause of the deficiency. Treatment may involve:  Adding iron-rich foods to your diet.  Taking iron supplements. Pregnant or breastfeeding women need to take extra iron because their normal diet usually does not provide the required amount.  Taking vitamins. Vitamin C improves the absorption of iron. Your health care provider may recommend that you take your iron tablets with a glass of orange juice or vitamin C supplement.  Medicines to make heavy menstrual flow lighter.  Surgery. HOME CARE INSTRUCTIONS   Take iron as directed by your health care provider.  If you cannot tolerate taking iron supplements by mouth, talk to your health care provider about taking them through a vein  (intravenously) or an injection into a muscle.  For the best iron absorption, iron supplements should be taken on an empty stomach. If you cannot tolerate them on an empty stomach, you may need to take them with food.  Do not drink milk or take antacids at the same time as your iron supplements. Milk and antacids may interfere with the absorption of iron.  Iron supplements can cause constipation. Make sure to include fiber in your diet to prevent constipation. A stool softener may also be recommended.  Take vitamins as directed by your health care provider.  Eat a diet rich in iron. Foods high in iron include liver, lean beef, whole-grain bread, eggs, dried fruit, and dark green leafy vegetables. SEEK IMMEDIATE MEDICAL CARE IF:   You faint. If this happens, do not drive. Call your local emergency services (911 in [place] if no other help is available.  You have chest pain.  You feel nauseous or vomit.  You have severe or increased shortness of breath with activity.  You feel weak.  You have a rapid heartbeat.  You have unexplained sweating.  You become light-headed when getting up from a chair or bed. MAKE SURE YOU:   Understand these instructions.  Will watch your condition.  Will get help right away if you are not doing well or get worse.   This information is not intended to replace advice given to you by your health care provider. Make sure you discuss any questions you have with your health care provider.   Document Released: 11/25/2000 Document Revised: 12/19/2014 Document Reviewed: 08/05/2013 Elsevier   Interactive Patient Education 2016 Elsevier Inc.  

## 2016-07-26 LAB — IBC PANEL
%SAT: 7 % — AB (ref 11–50)
TIBC: 492 ug/dL — AB (ref 250–450)
UIBC: 456 ug/dL — AB (ref 125–400)

## 2016-07-26 LAB — IRON: IRON: 36 ug/dL — AB (ref 40–190)

## 2016-07-27 NOTE — Addendum Note (Signed)
Addended by: Lurlean Nanny on: 07/27/2016 11:43 AM   Modules accepted: Orders

## 2016-09-05 ENCOUNTER — Telehealth: Payer: Self-pay | Admitting: Internal Medicine

## 2016-09-05 NOTE — Telephone Encounter (Signed)
We can increase Wellbutrin to 300 mg once daily, Lexapro is maxed out. If she is okay with this, send in new RX and have her update me in 4 weeks

## 2016-09-05 NOTE — Telephone Encounter (Signed)
Symptoms have worsened over the last few weeks. Please advise if you need more details

## 2016-09-05 NOTE — Telephone Encounter (Signed)
[facility] Patient Name: Teresa Villarreal DOB: 09-18-1971 Initial Comment Caller thinks she needs her anxiety meds adjusted Nurse Assessment Nurse: Dimas Chyle, RN, Dellis Filbert Date/Time Eilene Ghazi Time): 09/05/2016 8:53:08 AM Confirm and document reason for call. If symptomatic, describe symptoms. You must click the next button to save text entered. ---Caller thinks she needs her anxiety meds adjusted. On Wellbutrin and Lexapro. Symptoms have worsened over the last few weeks. Has the patient traveled out of the country within the last 30 days? ---No Does the patient have any new or worsening symptoms? ---Yes Will a triage be completed? ---Yes Related visit to physician within the last 2 weeks? ---No Does the PT have any chronic conditions? (i.e. diabetes, asthma, etc.) ---Yes List chronic conditions. ---Anxiety Is the patient pregnant or possibly pregnant? (Ask all females between the ages of 79-55) ---No Is this a behavioral health or substance abuse call? ---No Guidelines Guideline Title Affirmed Question Affirmed Notes Depression [1] Depression AND [2] worsening (e.g.,sleeping poorly, less able to do activities of daily living) Nausea Unexplained nausea (all triage questions negative) Final Disposition User Braddock, RN, Dellis Filbert Comments Nausea and cramping started on Saturday. No vomiting and only one bout of diarrhea. No fever. Referrals REFERRED TO PCP OFFICE REFERRED TO PCP OFFICE Disagree/Comply: Comply Disagree/Comply: Comply

## 2016-09-06 ENCOUNTER — Ambulatory Visit: Payer: Managed Care, Other (non HMO) | Admitting: Psychology

## 2016-09-06 ENCOUNTER — Ambulatory Visit: Payer: Self-pay | Admitting: Family Medicine

## 2016-09-06 ENCOUNTER — Ambulatory Visit: Payer: Self-pay | Admitting: Internal Medicine

## 2016-09-06 MED ORDER — BUPROPION HCL ER (XL) 300 MG PO TB24: 300.0000 mg | ORAL_TABLET | Freq: Every day | ORAL | 0 refills | Status: DC

## 2016-09-06 NOTE — Telephone Encounter (Signed)
Pt is aware as instructed... New Rx for 300mg  sent to the pharmacy

## 2016-09-06 NOTE — Addendum Note (Signed)
Addended by: Lurlean Nanny on: 09/06/2016 08:24 AM   Modules accepted: Orders

## 2016-09-08 ENCOUNTER — Other Ambulatory Visit: Payer: Self-pay | Admitting: Internal Medicine

## 2016-09-12 ENCOUNTER — Other Ambulatory Visit: Payer: Self-pay | Admitting: Internal Medicine

## 2016-09-26 ENCOUNTER — Ambulatory Visit (INDEPENDENT_AMBULATORY_CARE_PROVIDER_SITE_OTHER): Payer: Managed Care, Other (non HMO) | Admitting: Psychology

## 2016-09-26 DIAGNOSIS — F4323 Adjustment disorder with mixed anxiety and depressed mood: Secondary | ICD-10-CM

## 2016-10-10 ENCOUNTER — Telehealth: Payer: Self-pay

## 2016-10-10 NOTE — Telephone Encounter (Signed)
She could be perimenopausal or have fibroids. She needs to let me know when bleeding stops. If does not stop in 1 week, have her make appt

## 2016-10-10 NOTE — Telephone Encounter (Signed)
Pt left v/m; pt had menstrual period 2 weeks ago ending on 10/03/16. Menstrual period restarted on 10/08/16. Never has happen before; what to do?

## 2016-10-11 NOTE — Telephone Encounter (Signed)
Pt is aware as instructed--pt reports her bleeding is less than from yesterday---advised pt to make an appt if it continues pass 1 week---pt expressed understanding otherwise we will see her in 2 weeks for her CPE

## 2016-10-18 ENCOUNTER — Ambulatory Visit (INDEPENDENT_AMBULATORY_CARE_PROVIDER_SITE_OTHER): Payer: Managed Care, Other (non HMO) | Admitting: Psychology

## 2016-10-18 DIAGNOSIS — F330 Major depressive disorder, recurrent, mild: Secondary | ICD-10-CM | POA: Diagnosis not present

## 2016-10-18 DIAGNOSIS — F900 Attention-deficit hyperactivity disorder, predominantly inattentive type: Secondary | ICD-10-CM

## 2016-10-18 DIAGNOSIS — F411 Generalized anxiety disorder: Secondary | ICD-10-CM

## 2016-10-20 ENCOUNTER — Encounter: Payer: Self-pay | Admitting: Internal Medicine

## 2016-10-20 ENCOUNTER — Ambulatory Visit (INDEPENDENT_AMBULATORY_CARE_PROVIDER_SITE_OTHER): Payer: Managed Care, Other (non HMO) | Admitting: Internal Medicine

## 2016-10-20 VITALS — BP 130/80 | HR 77 | Temp 98.8°F | Wt 199.0 lb

## 2016-10-20 DIAGNOSIS — F419 Anxiety disorder, unspecified: Secondary | ICD-10-CM

## 2016-10-20 DIAGNOSIS — F329 Major depressive disorder, single episode, unspecified: Secondary | ICD-10-CM

## 2016-10-20 DIAGNOSIS — F418 Other specified anxiety disorders: Secondary | ICD-10-CM

## 2016-10-20 DIAGNOSIS — F900 Attention-deficit hyperactivity disorder, predominantly inattentive type: Secondary | ICD-10-CM

## 2016-10-20 DIAGNOSIS — F32A Depression, unspecified: Secondary | ICD-10-CM

## 2016-10-20 MED ORDER — ATOMOXETINE HCL 40 MG PO CAPS: 40.0000 mg | ORAL_CAPSULE | Freq: Every day | ORAL | 0 refills | Status: DC

## 2016-10-20 NOTE — Assessment & Plan Note (Signed)
This is an ongoing issue We will continue Lexapro, Wellbutrin and Xanax for now She will continue to follow with Dr. Rexene Edison

## 2016-10-20 NOTE — Patient Instructions (Signed)

## 2016-10-20 NOTE — Assessment & Plan Note (Signed)
Dr. Daron Offer report reviewed Will start with non stimulant therapy with Strattera eRx for Straterra 40 mg daily  Will follow up in 4 weeks

## 2016-10-20 NOTE — Progress Notes (Signed)
Subjective:    Patient ID: Teresa Villarreal, female    DOB: [birth year], 45 y.o.   MRN: [id number]  HPI  Pt presents to the clinic today to follow up psychology evaluation for inattention. She saw Dr. Lurline Hare 10/18/16. Att that time, she was diagnosed with adult onset ADD- primarily inattentive type. She already has known anxiety and depression, and is currently taking Lexapro, Wellbutrin and Xanax. She also follows with Dr. Rexene Edison for therapy. She sees her every 2 weeks. She is here to discuss treatment options for ADD.  Review of Systems      Past Medical History:  Diagnosis Date  . Chicken pox   . Depression   . Frequent headaches   . Genital warts   . History of kidney stones   . Hyperlipidemia     Current Outpatient Prescriptions  Medication Sig Dispense Refill  . ALPRAZolam (XANAX) 0.25 MG tablet Take 1 tablet (0.25 mg total) by mouth 3 (three) times daily as needed for anxiety. 30 tablet 0  . buPROPion (WELLBUTRIN XL) 300 MG 24 hr tablet Take 1 tablet (300 mg total) by mouth daily. 90 tablet 0  . escitalopram (LEXAPRO) 20 MG tablet TAKE 1 TABLET (20 MG TOTAL) BY MOUTH DAILY. 90 tablet 0  . fluticasone (FLONASE) 50 MCG/ACT nasal spray Place 2 sprays into both nostrils daily. 16 g 6  . ibuprofen (ADVIL,MOTRIN) 200 MG tablet Take 200 mg by mouth as needed.    . pseudoephedrine (SUDAFED) 30 MG tablet Take 30 mg by mouth as needed for congestion.     No current facility-administered medications for this visit.     No Known Allergies  Family History  Problem Relation Age of Onset  . Hyperlipidemia Mother   . Heart disease Mother   . Hypertension Mother   . Alzheimer's disease Mother   . COPD Mother   . Hyperlipidemia Father   . Hypertension Father   . Mitochondrial disorder Brother     Mitochondrial encephalomyopathy lactic acidosis  . Breast cancer Paternal Aunt   . Arthritis Maternal Grandmother     Social History   Social History  . Marital status: Married   Spouse name: N/A  . Number of children: N/A  . Years of education: N/A   Occupational History  . Not on file.   Social History Main Topics  . Smoking status: Never Smoker  . Smokeless tobacco: Never Used  . Alcohol use 0.0 oz/week     Comment: occasional  . Drug use: No  . Sexual activity: Yes   Other Topics Concern  . Not on file   Social History Narrative  . No narrative on file     Constitutional: Denies fever, malaise, fatigue, headache or abrupt weight changes.  Neurological: Pt reports inattention. Denies dizziness, difficulty with speech or problems with balance and coordination.  Psych: Pt reports anxiety and depression. Denies  SI/HI.  No other specific complaints in a complete review of systems (except as listed in HPI above).  Objective:   Physical Exam  BP 130/80   Pulse 77   Temp 98.8 F (37.1 C) (Oral)   Wt 199 lb (90.3 kg)   SpO2 98%   BMI 32.78 kg/m  Wt Readings from Last 3 Encounters:  10/20/16 199 lb (90.3 kg)  07/22/16 201 lb (91.2 kg)  06/29/16 201 lb 8 oz (91.4 kg)    General: Appears her stated age, in NAD. Cardiovascular: Normal rate and rhythm. S1,S2 noted.  No murmur, rubs or  gallops noted. Pulmonary/Chest: Normal effort and positive vesicular breath sounds. No respiratory distress. No wheezes, rales or ronchi noted.  Neurological: Alert and oriented.  Psychiatric: Mood and affect mildly flat. Behavior is normal. Judgment and thought content normal.     BMET    Component Value Date/Time   NA 139 07/30/2015 1516   K 4.5 07/30/2015 1516   CL 104 07/30/2015 1516   CO2 29 07/30/2015 1516   GLUCOSE 87 07/30/2015 1516   BUN 17 07/30/2015 1516   CREATININE 0.80 07/30/2015 1516   CALCIUM 9.2 07/30/2015 1516    Lipid Panel     Component Value Date/Time   CHOL 224 (H) 07/30/2015 1516   TRIG 239.0 (H) 07/30/2015 1516   HDL 39.00 (L) 07/30/2015 1516   CHOLHDL 6 07/30/2015 1516   VLDL 47.8 (H) 07/30/2015 1516    CBC      Component Value Date/Time   WBC 6.7 07/22/2016 1459   RBC 3.96 07/22/2016 1459   HGB 11.2 (L) 07/22/2016 1459   HCT 34.4 (L) 07/22/2016 1459   PLT 337 07/22/2016 1459   MCV 86.9 07/22/2016 1459   MCH 28.3 07/22/2016 1459   MCHC 32.6 07/22/2016 1459   RDW 14.6 07/22/2016 1459   LYMPHSABS 1,876 07/22/2016 1459   MONOABS 536 07/22/2016 1459   EOSABS 134 07/22/2016 1459   BASOSABS 67 07/22/2016 1459    Hgb A1C Lab Results  Component Value Date   HGBA1C 4.7 07/30/2015      Assessment & Plan:

## 2016-10-21 ENCOUNTER — Encounter: Payer: Managed Care, Other (non HMO) | Admitting: Internal Medicine

## 2016-10-27 ENCOUNTER — Ambulatory Visit (INDEPENDENT_AMBULATORY_CARE_PROVIDER_SITE_OTHER): Payer: Managed Care, Other (non HMO) | Admitting: Internal Medicine

## 2016-10-27 ENCOUNTER — Other Ambulatory Visit: Payer: Self-pay | Admitting: Internal Medicine

## 2016-10-27 ENCOUNTER — Encounter: Payer: Self-pay | Admitting: Internal Medicine

## 2016-10-27 VITALS — BP 132/84 | HR 85 | Temp 98.6°F | Ht 62.25 in | Wt 195.8 lb

## 2016-10-27 DIAGNOSIS — Z1231 Encounter for screening mammogram for malignant neoplasm of breast: Secondary | ICD-10-CM

## 2016-10-27 DIAGNOSIS — Z124 Encounter for screening for malignant neoplasm of cervix: Secondary | ICD-10-CM

## 2016-10-27 DIAGNOSIS — Z0000 Encounter for general adult medical examination without abnormal findings: Secondary | ICD-10-CM | POA: Diagnosis not present

## 2016-10-27 DIAGNOSIS — Z0001 Encounter for general adult medical examination with abnormal findings: Secondary | ICD-10-CM

## 2016-10-27 LAB — CBC
HEMATOCRIT: 35.7 % — AB (ref 36.0–46.0)
HEMOGLOBIN: 11.9 g/dL — AB (ref 12.0–15.0)
MCHC: 33.3 g/dL (ref 30.0–36.0)
MCV: 87.8 fl (ref 78.0–100.0)
PLATELETS: 292 10*3/uL (ref 150.0–400.0)
RBC: 4.07 Mil/uL (ref 3.87–5.11)
RDW: 14.5 % (ref 11.5–15.5)
WBC: 6.7 10*3/uL (ref 4.0–10.5)

## 2016-10-27 LAB — COMPREHENSIVE METABOLIC PANEL
ALBUMIN: 4.3 g/dL (ref 3.5–5.2)
ALK PHOS: 97 U/L (ref 39–117)
ALT: 16 U/L (ref 0–35)
AST: 16 U/L (ref 0–37)
BILIRUBIN TOTAL: 0.4 mg/dL (ref 0.2–1.2)
BUN: 10 mg/dL (ref 6–23)
CALCIUM: 9.3 mg/dL (ref 8.4–10.5)
CO2: 32 mEq/L (ref 19–32)
Chloride: 101 mEq/L (ref 96–112)
Creatinine, Ser: 0.83 mg/dL (ref 0.40–1.20)
GFR: 78.63 mL/min (ref >60.00)
GLUCOSE: 95 mg/dL (ref 70–99)
POTASSIUM: 3.6 meq/L (ref 3.5–5.1)
Sodium: 140 mEq/L (ref 135–145)
TOTAL PROTEIN: 7.1 g/dL (ref 6.0–8.3)

## 2016-10-27 LAB — LIPID PANEL
CHOLESTEROL: 242 mg/dL — AB (ref 0–200)
HDL: 47.2 mg/dL (ref >39.00)
LDL Cholesterol: 159 mg/dL — ABNORMAL HIGH (ref 0–99)
NONHDL: 194.67
TRIGLYCERIDES: 177 mg/dL — AB (ref 0.0–149.0)
Total CHOL/HDL Ratio: 5
VLDL: 35.4 mg/dL (ref 0.0–40.0)

## 2016-10-27 NOTE — Patient Instructions (Signed)

## 2016-10-27 NOTE — Progress Notes (Signed)
Subjective:    Patient ID: Rollene Fare, female    DOB: 1971/01/22, 45 y.o.   MRN: [id number]  HPI  Pt presents to the clinic today for her annual exam.  Flu: 10/2014 Tetanus: 09/2011 Pap Smear: 2012 Mammogram: 11/2014 at [facility]: yearly Dentist: biannually  Diet: She does eat meat. She does consume fruits and veggies daily. She does eat some fried foods. She drinks mostly diet soda. Exercise: Walking 2 x week for 30 minutes.  Review of Systems      Past Medical History:  Diagnosis Date  . Chicken pox   . Depression   . Frequent headaches   . Genital warts   . History of kidney stones   . Hyperlipidemia     Current Outpatient Prescriptions  Medication Sig Dispense Refill  . ALPRAZolam (XANAX) 0.25 MG tablet Take 1 tablet (0.25 mg total) by mouth 3 (three) times daily as needed for anxiety. 30 tablet 0  . atomoxetine (STRATTERA) 40 MG capsule Take 1 capsule (40 mg total) by mouth daily. 30 capsule 0  . buPROPion (WELLBUTRIN XL) 300 MG 24 hr tablet Take 1 tablet (300 mg total) by mouth daily. 90 tablet 0  . escitalopram (LEXAPRO) 20 MG tablet TAKE 1 TABLET (20 MG TOTAL) BY MOUTH DAILY. 90 tablet 0  . fluticasone (FLONASE) 50 MCG/ACT nasal spray Place 2 sprays into both nostrils daily. 16 g 6  . ibuprofen (ADVIL,MOTRIN) 200 MG tablet Take 200 mg by mouth as needed.    . pseudoephedrine (SUDAFED) 30 MG tablet Take 30 mg by mouth as needed for congestion.     No current facility-administered medications for this visit.     No Known Allergies  Family History  Problem Relation Age of Onset  . Hyperlipidemia Mother   . Heart disease Mother   . Hypertension Mother   . Alzheimer's disease Mother   . COPD Mother   . Hyperlipidemia Father   . Hypertension Father   . Mitochondrial disorder Brother     Mitochondrial encephalomyopathy lactic acidosis  . Breast cancer Paternal Aunt   . Arthritis Maternal Grandmother     Social History   Social History    . Marital status: Married    Spouse name: N/A  . Number of children: N/A  . Years of education: N/A   Occupational History  . Not on file.   Social History Main Topics  . Smoking status: Never Smoker  . Smokeless tobacco: Never Used  . Alcohol use 0.0 oz/week     Comment: occasional  . Drug use: No  . Sexual activity: Yes   Other Topics Concern  . Not on file   Social History Narrative  . No narrative on file     Constitutional: Denies fever, malaise, fatigue, headache or abrupt weight changes.  HEENT: Pt reports right ear pain. She also reports a bump on the roof of her mouth. Denies eye pain, eye redness, ringing in the ears, wax buildup, runny nose, nasal congestion, bloody nose, or sore throat. Respiratory: Denies difficulty breathing, shortness of breath, cough or sputum production.   Cardiovascular: Denies chest pain, chest tightness, palpitations or swelling in the hands or feet.  Gastrointestinal: Pt reports intermittent constipation. Denies abdominal pain, bloating, diarrhea or blood in the stool.  GU: Denies urgency, frequency, pain with urination, burning sensation, blood in urine, odor or discharge. Musculoskeletal: Denies decrease in range of motion, difficulty with gait, muscle pain or joint pain and swelling.  Skin: Pt  reports a bump in the vaginal area. Denies redness, rashes, or ulcercations.  Neurological: Denies dizziness, difficulty with memory, difficulty with speech or problems with balance and coordination.  Psych: Pt has history of anxiety. Denies SI/HI.  No other specific complaints in a complete review of systems (except as listed in HPI above).  Objective:   Physical Exam   BP 132/84   Pulse 85   Temp 98.6 F (37 C) (Oral)   Ht 5' 2.25" (1.581 m)   Wt 195 lb 12 oz (88.8 kg)   LMP 10/08/2016   SpO2 98%   BMI 35.52 kg/m  Wt Readings from Last 3 Encounters:  10/27/16 195 lb 12 oz (88.8 kg)  10/20/16 199 lb (90.3 kg)  07/22/16 201 lb  (91.2 kg)    General: Appears her stated age, obese in NAD. Skin: Warm, dry and intact.  HEENT: Head: normal shape and size; Eyes: sclera white, no icterus, conjunctiva pink, PERRLA and EOMs intact; Ears: Tm's gray and intact, normal light reflex; Throat/Mouth: Teeth present, mucosa pink and moist, no exudate, lesions or ulcerations noted.  Neck:  Neck supple, trachea midline. No masses, lumps or thyromegaly present.  Cardiovascular: Normal rate and rhythm. S1,S2 noted.  No murmur, rubs or gallops noted. No JVD or BLE edema. No carotid bruits noted. Pulmonary/Chest: Normal effort and positive vesicular breath sounds. No respiratory distress. No wheezes, rales or ronchi noted.  Abdomen: Soft and nontender. Normal bowel sounds. No distention or masses noted. Liver, spleen and kidneys non palpable. Pelvic: Normal female anatomy. Small 0.5 cm cyst noted of right labia. Cervix without changes. Small amount of discharge noted in the vaginal vault. No CMT. Adnexa non palpable. Musculoskeletal: Normal range of motion. Strength 5/5 BUE/BLE. No difficulty with gait.  Neurological: Alert and oriented. Cranial nerves II-XII grossly intact. Coordination normal.  Psychiatric: Mood and affect normal. Behavior is normal. Judgment and thought content normal.     BMET    Component Value Date/Time   NA 139 07/30/2015 1516   K 4.5 07/30/2015 1516   CL 104 07/30/2015 1516   CO2 29 07/30/2015 1516   GLUCOSE 87 07/30/2015 1516   BUN 17 07/30/2015 1516   CREATININE 0.80 07/30/2015 1516   CALCIUM 9.2 07/30/2015 1516    Lipid Panel     Component Value Date/Time   CHOL 224 (H) 07/30/2015 1516   TRIG 239.0 (H) 07/30/2015 1516   HDL 39.00 (L) 07/30/2015 1516   CHOLHDL 6 07/30/2015 1516   VLDL 47.8 (H) 07/30/2015 1516    CBC    Component Value Date/Time   WBC 6.7 07/22/2016 1459   RBC 3.96 07/22/2016 1459   HGB 11.2 (L) 07/22/2016 1459   HCT 34.4 (L) 07/22/2016 1459   PLT 337 07/22/2016 1459   MCV  86.9 07/22/2016 1459   MCH 28.3 07/22/2016 1459   MCHC 32.6 07/22/2016 1459   RDW 14.6 07/22/2016 1459   LYMPHSABS 1,876 07/22/2016 1459   MONOABS 536 07/22/2016 1459   EOSABS 134 07/22/2016 1459   BASOSABS 67 07/22/2016 1459    Hgb A1C Lab Results  Component Value Date   HGBA1C 4.7 07/30/2015           Assessment & Plan:   Preventative Health Maintenance:  She declines flu shot today Tetanus UTD Pap smear today- she declines STD screening Mammogram ordered- she will call [facility] to schedule (number provided) Encouraged her to consume a balanced diet and exercise regimen Advised her to see an eye doctor  and dentist annually Will check CBC, CMET, Lipid profile today  RTC in 6 months to follow up chronic conditions Jonnatan Hanners, NP

## 2016-10-28 ENCOUNTER — Other Ambulatory Visit (HOSPITAL_COMMUNITY)
Admission: RE | Admit: 2016-10-28 | Discharge: 2016-10-28 | Disposition: A | Discharge status: Home / Self Care | Payer: Managed Care, Other (non HMO) | Source: Ambulatory Visit | Attending: Internal Medicine | Admitting: Internal Medicine

## 2016-10-28 DIAGNOSIS — Z01419 Encounter for gynecological examination (general) (routine) without abnormal findings: Secondary | ICD-10-CM | POA: Insufficient documentation

## 2016-10-28 DIAGNOSIS — R87810 Cervical high risk human papillomavirus (HPV) DNA test positive: Secondary | ICD-10-CM | POA: Diagnosis not present

## 2016-10-28 DIAGNOSIS — Z1151 Encounter for screening for human papillomavirus (HPV): Secondary | ICD-10-CM | POA: Diagnosis present

## 2016-10-28 NOTE — Addendum Note (Signed)
Addended by: Lurlean Nanny on: 10/28/2016 10:12 AM   Modules accepted: Orders

## 2016-11-02 LAB — CYTOLOGY - PAP
DIAGNOSIS: NEGATIVE
HPV (WINDOPATH): DETECTED — AB
HPV 16/18/45 GENOTYPING: NEGATIVE

## 2016-11-08 ENCOUNTER — Ambulatory Visit (INDEPENDENT_AMBULATORY_CARE_PROVIDER_SITE_OTHER): Payer: Managed Care, Other (non HMO) | Admitting: Psychology

## 2016-11-08 DIAGNOSIS — F909 Attention-deficit hyperactivity disorder, unspecified type: Secondary | ICD-10-CM | POA: Diagnosis not present

## 2016-11-08 DIAGNOSIS — F4323 Adjustment disorder with mixed anxiety and depressed mood: Secondary | ICD-10-CM

## 2016-11-15 ENCOUNTER — Emergency Department
Admission: EM | Admit: 2016-11-15 | Discharge: 2016-11-15 | Disposition: A | Discharge status: Home / Self Care | Payer: Managed Care, Other (non HMO) | Attending: Emergency Medicine | Admitting: Emergency Medicine

## 2016-11-15 ENCOUNTER — Encounter: Payer: Self-pay | Admitting: Emergency Medicine

## 2016-11-15 ENCOUNTER — Telehealth: Payer: Self-pay | Admitting: *Deleted

## 2016-11-15 ENCOUNTER — Other Ambulatory Visit: Payer: Self-pay | Admitting: Internal Medicine

## 2016-11-15 DIAGNOSIS — N3000 Acute cystitis without hematuria: Secondary | ICD-10-CM | POA: Insufficient documentation

## 2016-11-15 DIAGNOSIS — Z79899 Other long term (current) drug therapy: Secondary | ICD-10-CM | POA: Insufficient documentation

## 2016-11-15 DIAGNOSIS — F900 Attention-deficit hyperactivity disorder, predominantly inattentive type: Secondary | ICD-10-CM | POA: Diagnosis not present

## 2016-11-15 DIAGNOSIS — R300 Dysuria: Secondary | ICD-10-CM | POA: Diagnosis present

## 2016-11-15 LAB — URINALYSIS, COMPLETE (UACMP) WITH MICROSCOPIC
BILIRUBIN URINE: NEGATIVE
Glucose, UA: NEGATIVE mg/dL
Ketones, ur: NEGATIVE mg/dL
Nitrite: POSITIVE — AB
PH: 6 (ref 5.0–8.0)
Protein, ur: NEGATIVE mg/dL
SPECIFIC GRAVITY, URINE: 1.004 — AB (ref 1.005–1.030)

## 2016-11-15 MED ORDER — SULFAMETHOXAZOLE-TRIMETHOPRIM 800-160 MG PO TABS
1.0000 | ORAL_TABLET | Freq: Once | ORAL | Status: AC
  Administered 2016-11-15: 1 via ORAL
  Filled 2016-11-15: qty 1

## 2016-11-15 MED ORDER — SULFAMETHOXAZOLE-TRIMETHOPRIM 800-160 MG PO TABS: 1.0000 | ORAL_TABLET | Freq: Two times a day (BID) | ORAL | 0 refills | Status: AC

## 2016-11-15 NOTE — ED Notes (Signed)

## 2016-11-15 NOTE — Telephone Encounter (Signed)
Patient called stating that she thinks that she may have a UTI or may have passed another kidney stone because she is burning with urination and there was some blood in her urine. Patient requested that an antibiotic be sent in for her. Advised patient that she would need to be seen for an antibiotic. Patient stated that she just took some AZO and hopes that that is going to help with her symptoms. No appointments available in office or any other [place] offices today. Appointment scheduled 11/16/16 with Allie Bossier NP

## 2016-11-15 NOTE — Telephone Encounter (Signed)
RX printed and signed and placed in MYD box 

## 2016-11-15 NOTE — Telephone Encounter (Signed)
Noted and agree with plan.

## 2016-11-15 NOTE — Telephone Encounter (Signed)
Last filled 10/20/16--11/19/16 is on Sat--please advise

## 2016-11-15 NOTE — ED Provider Notes (Signed)
[facility] Emergency Department Provider Note ____________________________________________  Time seen: 1838  I have reviewed the triage vital signs and the nursing notes.  HISTORY  Chief Complaint  Urinary Tract Infection  HPI Teresa Villarreal is a 45 y.o. female presents to the ED for evaluation of dysuria, urgency, and mild hematuria today. She denies frank fevers, but notes chills and sweats. She describes lower pelvic pressure and cramping. She reports nausea without vomiting. She also experienced some loose stools today. She denies sick contacts, recent travel, or bad food exposure. She dosed an AZO with limited benefit. She gives a history of kidney stones and fibroids. Her LMP was 10 days ago.   Past Medical History:  Diagnosis Date  . Chicken pox   . Depression   . Frequent headaches   . Genital warts   . History of kidney stones   . Hyperlipidemia     Patient Active Problem List   Diagnosis Date Noted  . 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 Anxiety and depression 07/29/2015  . Frequent headaches 07/29/2015  . HLD (hyperlipidemia) 07/29/2015    Past Surgical History:  Procedure Laterality Date  . CYSTOSCOPY  2010  . HYSTEROSCOPY  2010  . KNEE ARTHROSCOPY Left 1990    Prior to Admission medications   Medication Sig Start Date End Date Taking? Authorizing Provider  ALPRAZolam (XANAX) 0.25 MG tablet Take 1 tablet (0.25 mg total) by mouth 3 (three) times daily as needed for anxiety. 02/03/16   Lucille Passy, MD  atomoxetine (STRATTERA) 40 MG capsule TAKE 1 CAPSULE (40 MG TOTAL) BY MOUTH DAILY. 11/15/16   Jearld Fenton, NP  buPROPion (WELLBUTRIN XL) 300 MG 24 hr tablet Take 1 tablet (300 mg total) by mouth daily. 09/06/16   Jearld Fenton, NP  escitalopram (LEXAPRO) 20 MG tablet TAKE 1 TABLET (20 MG TOTAL) BY MOUTH DAILY. 09/09/16   Jearld Fenton, NP  fluticasone (FLONASE) 50 MCG/ACT nasal spray  Place 2 sprays into both nostrils daily. 02/17/16   Ria Bush, MD  ibuprofen (ADVIL,MOTRIN) 200 MG tablet Take 200 mg by mouth as needed.    Historical Provider, MD  pseudoephedrine (SUDAFED) 30 MG tablet Take 30 mg by mouth as needed for congestion.    Historical Provider, MD  sulfamethoxazole-trimethoprim (BACTRIM DS,SEPTRA DS) 800-160 MG tablet Take 1 tablet by mouth 2 (two) times daily. 11/15/16 11/22/16  Dannielle Karvonen Serinity Ware, PA-C    Allergies Patient has no known allergies.  Family History  Problem Relation Age of Onset  . Hyperlipidemia Mother   . Heart disease Mother   . Hypertension Mother   . Alzheimer's disease Mother   . COPD Mother   . Hyperlipidemia Father   . Hypertension Father   . Mitochondrial disorder Brother     Mitochondrial encephalomyopathy lactic acidosis  . Breast cancer Paternal Aunt   . Arthritis Maternal Grandmother     Social History Social History  Substance Use Topics  . Smoking status: Never Smoker  . Smokeless tobacco: Never Used  . Alcohol use 0.0 oz/week     Comment: occasional    Review of Systems  Constitutional: Negative for fever. Cardiovascular: Negative for chest pain. Respiratory: Negative for shortness of breath. Gastrointestinal: Negative for abdominal pain, vomiting and diarrhea. Reports nausea Genitourinary: positive for dysuria. Musculoskeletal: Negative for back pain. Skin: Negative for rash. Neurological: Negative for headaches, focal weakness or numbness. ____________________________________________  PHYSICAL EXAM:  VITAL SIGNS: ED Triage Vitals  Enc Vitals Group     BP 11/15/16 1807 (!) 157/96     Pulse Rate 11/15/16 1807 100     Resp 11/15/16 1807 18     Temp 11/15/16 1807 98.1 F (36.7 C)     Temp Source 11/15/16 1807 Oral     SpO2 11/15/16 1807 98 %     Weight 11/15/16 1808 195 lb (88.5 kg)     Height 11/15/16 1808 5\' 2"  (1.575 m)     Head Circumference --      Peak Flow --      Pain Score  11/15/16 1917 3     Pain Loc --      Pain Edu? --      Excl. in [place]? --     Constitutional: Alert and oriented. Well appearing and in no distress. Head: Normocephalic and atraumatic. Cardiovascular: Normal rate, regular rhythm. Normal distal pulses. Respiratory: Normal respiratory effort. No wheezes/rales/rhonchi. Gastrointestinal: Soft and nontender. No distention. No CVA tenderness Musculoskeletal: Nontender with normal range of motion in all extremities.  Neurologic:  Normal gait without ataxia. Normal speech and language. No gross focal neurologic deficits are appreciated. ____________________________________________   LABS (pertinent positives/negatives) Labs Reviewed  URINALYSIS, COMPLETE (UACMP) WITH MICROSCOPIC - Abnormal; Notable for the following:       Result Value   Color, Urine AMBER (*)    APPearance CLEAR (*)    Specific Gravity, Urine 1.004 (*)    Hgb urine dipstick LARGE (*)    Nitrite POSITIVE (*)    Leukocytes, UA MODERATE (*)    Bacteria, UA RARE (*)    Squamous Epithelial / LPF 0-5 (*)    All other components within normal limits  URINE CULTURE  ____________________________________________  PROCEDURES  Bactrim DS 1 PO ____________________________________________  INITIAL IMPRESSION / ASSESSMENT AND PLAN / ED COURSE  Patient with symptoms consistent with an acute cystitis. She is discharged with a prescription for Bactrim to dose as directed. She can continue to use Azo as needed for bladder spasm. She should follow with her primary doctor or gynecologist in 7-10 days for test of cure. Urine culture is pending at the time of discharge.  Clinical Course    ____________________________________________  FINAL CLINICAL IMPRESSION(S) / ED DIAGNOSES  Final diagnoses:  Acute cystitis without hematuria      Melvenia Needles, PA-C 11/15/16 2040    Earleen Newport, MD 11/15/16 2200

## 2016-11-15 NOTE — ED Triage Notes (Addendum)
Pt with UTI symptoms starting today. Pt took AZO around noon which did relieve her sx, but states is starting to return. Pain when urinating.

## 2016-11-15 NOTE — Discharge Instructions (Signed)
Take the antibiotic as directed. Follow-up with your provider for recheck after treatment.

## 2016-11-16 ENCOUNTER — Ambulatory Visit: Payer: Self-pay | Admitting: Primary Care

## 2016-11-16 MED ORDER — ATOMOXETINE HCL 40 MG PO CAPS: 40.0000 mg | ORAL_CAPSULE | Freq: Every day | ORAL | 0 refills | Status: DC

## 2016-11-16 NOTE — Addendum Note (Signed)
Addended by: Lurlean Nanny on: 11/16/2016 12:26 PM   Modules accepted: Orders

## 2016-11-17 LAB — URINE CULTURE: SPECIAL REQUESTS: NORMAL

## 2016-11-22 ENCOUNTER — Ambulatory Visit: Payer: Self-pay | Admitting: Internal Medicine

## 2016-12-04 ENCOUNTER — Other Ambulatory Visit: Payer: Self-pay | Admitting: Internal Medicine

## 2016-12-08 ENCOUNTER — Ambulatory Visit
Admission: RE | Admit: 2016-12-08 | Discharge: 2016-12-08 | Disposition: A | Discharge status: Home / Self Care | Payer: Managed Care, Other (non HMO) | Source: Ambulatory Visit | Attending: Internal Medicine | Admitting: Internal Medicine

## 2016-12-08 DIAGNOSIS — Z1231 Encounter for screening mammogram for malignant neoplasm of breast: Secondary | ICD-10-CM | POA: Diagnosis present

## 2016-12-13 ENCOUNTER — Inpatient Hospital Stay
Admission: RE | Admit: 2016-12-13 | Discharge: 2016-12-13 | Disposition: A | Discharge status: Home / Self Care | Payer: Self-pay | Source: Ambulatory Visit | Attending: *Deleted | Admitting: *Deleted

## 2016-12-13 ENCOUNTER — Other Ambulatory Visit: Payer: Self-pay | Admitting: *Deleted

## 2016-12-13 DIAGNOSIS — Z1231 Encounter for screening mammogram for malignant neoplasm of breast: Secondary | ICD-10-CM

## 2016-12-19 ENCOUNTER — Other Ambulatory Visit: Payer: Self-pay | Admitting: Internal Medicine

## 2016-12-19 ENCOUNTER — Telehealth: Payer: Self-pay | Admitting: Internal Medicine

## 2016-12-19 NOTE — Telephone Encounter (Signed)
Strattera last filled on 11/16/16 for 11/19/16--please advise

## 2016-12-19 NOTE — Telephone Encounter (Signed)
She did not need to go to UC, I would have worked her in!

## 2016-12-19 NOTE — Telephone Encounter (Signed)
RX printed and signed and placed in MYD box 

## 2016-12-19 NOTE — Telephone Encounter (Signed)
[place] Patient Name: Teresa Villarreal DOB: 05-13-1971 Initial Comment Caller states that she thinks that she has a sinus infection and she was wondering if there could be a prescription called in for her. She is having headaches, sinus pressure, and fatigue. She has been taking ibuprofen. This has been going on for a least a week. Nurse Assessment Nurse: Markus Daft, RN, Sherre Poot Date/Time (Eastern Time): 12/19/2016 11:27:00 AM Confirm and document reason for call. If symptomatic, describe symptoms. ---Caller states that she is having headaches - almost like a migraine, sinus pressure, and fatigue. She has been taking ibuprofen/Sudafed. This has been going on for a least a week. She thinks that she has a sinus infection and she was wondering if there could be a prescription called in for her? Does the patient have any new or worsening symptoms? ---Yes Will a triage be completed? ---Yes Related visit to physician within the last 2 weeks? ---No Does the PT have any chronic conditions? (i.e. diabetes, asthma, etc.) ---No Is the patient pregnant or possibly pregnant? (Ask all females between the ages of 61-55) ---No Is this a behavioral health or substance abuse call? ---No Guidelines Guideline Title Affirmed Question Affirmed Notes Sinus Pain or Congestion Earache Final Disposition User See Physician within [place], RN, [place] Comments RN explained that antibiotics are not called in w/o being seen first. No available appts for [facility] or [place] office today, and pt has to be seen today she states. She will go to an UC. Referrals GO TO FACILITY UNDECIDED Disagree/Comply: Comply

## 2017-01-03 ENCOUNTER — Telehealth: Payer: Self-pay

## 2017-01-03 NOTE — Telephone Encounter (Signed)
We can try Concerta or Vyvanse/ Tell her to research these and let me know which one she prefers.

## 2017-01-03 NOTE — Telephone Encounter (Signed)
Pt left v/m; pt thinks strattera is causing constipation and pt does not think strattera is working correctly for her; pt wants to change to different med for ADHD. Last annual 10/27/16. Pt request cb.

## 2017-01-04 NOTE — Telephone Encounter (Signed)
Left detailed msg on VM per HIPAA  

## 2017-01-05 ENCOUNTER — Other Ambulatory Visit: Payer: Self-pay | Admitting: Internal Medicine

## 2017-01-05 MED ORDER — METHYLPHENIDATE HCL ER 18 MG PO TB24: 18.0000 mg | ORAL_TABLET | Freq: Every day | ORAL | 0 refills | Status: DC

## 2017-01-05 NOTE — Telephone Encounter (Signed)
Patient returned Melanie's call. Patient would like to try generic Concerta.  Patient uses [facility].  Any questions, please call patient back [phone].

## 2017-01-05 NOTE — Telephone Encounter (Signed)
RX printed and signed. She can not fill until 01/19/17. I started on lowest dose, if not effective, we may need to increase

## 2017-01-05 NOTE — Progress Notes (Signed)
concerta

## 2017-01-06 NOTE — Telephone Encounter (Signed)
Patient called to check on rx.  Please call patient back at [phone].

## 2017-01-09 NOTE — Telephone Encounter (Signed)
Rx left in front office for pick up and pt is aware  

## 2017-01-18 ENCOUNTER — Ambulatory Visit (INDEPENDENT_AMBULATORY_CARE_PROVIDER_SITE_OTHER): Payer: Managed Care, Other (non HMO) | Admitting: Psychology

## 2017-01-18 DIAGNOSIS — F4323 Adjustment disorder with mixed anxiety and depressed mood: Secondary | ICD-10-CM

## 2017-01-23 ENCOUNTER — Telehealth: Payer: Self-pay

## 2017-01-23 MED ORDER — METHYLPHENIDATE HCL ER 20 MG PO TBCR: 20.0000 mg | EXTENDED_RELEASE_TABLET | Freq: Every day | ORAL | 0 refills | Status: DC

## 2017-01-23 NOTE — Telephone Encounter (Signed)
RX printed and signed and placed in MYD box 

## 2017-01-23 NOTE — Telephone Encounter (Signed)
Pt left v/m; Generic Concerta was too expensive; cost to pt was $200.00 +. Pt spoke with ins co and request rx for Metadate 20 mg extended release to take once daily. Pt request cb when ready for pick up.

## 2017-01-26 ENCOUNTER — Telehealth: Payer: Self-pay | Admitting: Internal Medicine

## 2017-01-26 ENCOUNTER — Telehealth: Payer: Self-pay

## 2017-01-26 NOTE — Telephone Encounter (Signed)
Patient Name: Teresa Villarreal DOB: Nov 19, 1971 Initial Comment caller states she has constipation Nurse Assessment Nurse: Vivia Birmingham, RN, Chrissy Date/Time (Eastern Time): 01/26/2017 11:33:16 AM Confirm and document reason for call. If symptomatic, describe symptoms. ---Caller states that she started taking Stratera in November and has been constipated, using enemas frequently. She has been using miralax and feels like she has pressure and pain in vaginal and rectum. Does the patient have any new or worsening symptoms? ---Yes Will a triage be completed? ---Yes Related visit to physician within the last 2 weeks? ---No Does the PT have any chronic conditions? (i.e. diabetes, asthma, etc.) ---No Is the patient pregnant or possibly pregnant? (Ask all females between the ages of 60-55) ---No Is this a behavioral health or substance abuse call? ---No Guidelines Guideline Title Affirmed Question Affirmed Notes Constipation [1] Sudden onset rectal pain (straining, rectal pressure or fullness) AND [2] NOT better after SITZ bath, suppository or enema Final Disposition User See Physician within 4 Hours (or PCP triage) Vivia Birmingham, RN, [place], NP suggests that patient take oral dulcolax, then a fleets enema, if no results do second fleets enema, if no results go to urgent care. Referrals REFERRED TO PCP OFFICE Disagree/Comply: Comply

## 2017-01-26 NOTE — Telephone Encounter (Signed)
Also see phone note 01/26/17.

## 2017-01-26 NOTE — Telephone Encounter (Signed)
Chrissy with [facility] said pt is calling with constipation for awhile; pt cannot remember last normal BM; thinks had some BM last week. Pt has taken miralax for 3 days with no relief; states pt tried to dig out stool but unable to.now pt having abd pain and cannot sit up straight due to pain. Webb Silversmith NP recommends one oral dulcolax, then a fleets enema; if no results may repeat fleets enema. If still no results pt can go to UC. Chrissy voiced understanding and will let pt know.

## 2017-02-21 ENCOUNTER — Telehealth: Payer: Self-pay

## 2017-02-21 DIAGNOSIS — F909 Attention-deficit hyperactivity disorder, unspecified type: Secondary | ICD-10-CM

## 2017-02-21 MED ORDER — DEXMETHYLPHENIDATE HCL ER 10 MG PO CP24: 10.0000 mg | ORAL_CAPSULE | Freq: Every day | ORAL | 0 refills | Status: DC

## 2017-02-21 MED ORDER — DEXMETHYLPHENIDATE HCL 10 MG PO TABS: 10.0000 mg | ORAL_TABLET | Freq: Two times a day (BID) | ORAL | 0 refills | Status: DC

## 2017-02-21 NOTE — Addendum Note (Signed)
Addended by: Lurlean Nanny on: 02/21/2017 04:56 PM   Modules accepted: Orders

## 2017-02-21 NOTE — Telephone Encounter (Signed)
RX printed and signed and placed in MYD box 

## 2017-02-21 NOTE — Addendum Note (Signed)
Addended by: Jearld Fenton on: 02/21/2017 03:28 PM   Modules accepted: Orders

## 2017-02-21 NOTE — Telephone Encounter (Signed)
Pt left v/m; pt thinks dexmethylphenidate 5 mg or 10 mg would be a good substitute for Strattera that her ins would pay for. Pt request printed rx that pt could pick up on 02/22/17. Pt request cb. Pt seen 10/27/16.

## 2017-02-21 NOTE — Telephone Encounter (Signed)
Note, Rx printed and ready for pick up.

## 2017-02-21 NOTE — Addendum Note (Signed)
Addended by: Pleas Koch on: 02/21/2017 05:10 PM   Modules accepted: Orders

## 2017-02-21 NOTE — Telephone Encounter (Signed)
I called pt to let her know Rx was ready for pick, she asked if it was XR and I told her yes.... Pt states that her insurance will not pay for XR, it will have to be regular--pt wants to pick Rx up in the AM-- I called Webb Silversmith to see if change would be okay and there was no answer--- please advise if you can refill in her absence... I will shred the original previous Rx for the 10mg  XR

## 2017-03-01 ENCOUNTER — Ambulatory Visit: Payer: Managed Care, Other (non HMO) | Admitting: Psychology

## 2017-03-18 ENCOUNTER — Other Ambulatory Visit: Payer: Self-pay | Admitting: Internal Medicine

## 2017-03-20 ENCOUNTER — Other Ambulatory Visit: Payer: Self-pay

## 2017-03-20 DIAGNOSIS — F909 Attention-deficit hyperactivity disorder, unspecified type: Secondary | ICD-10-CM

## 2017-03-20 MED ORDER — DEXMETHYLPHENIDATE HCL 10 MG PO TABS: 10.0000 mg | ORAL_TABLET | Freq: Two times a day (BID) | ORAL | 0 refills | Status: DC

## 2017-03-20 NOTE — Telephone Encounter (Signed)
Pt left v/m requesting refill dexmethylphenidate. Last printed # 60 on 02/21/17; annual exam on 10/27/16.,

## 2017-03-20 NOTE — Telephone Encounter (Signed)
Spoke to pt and informed her Rx is available for pickup from the front desk 

## 2017-03-20 NOTE — Telephone Encounter (Signed)
RX printed and placed in MYD box

## 2017-03-21 ENCOUNTER — Encounter: Payer: Self-pay | Admitting: Internal Medicine

## 2017-03-22 ENCOUNTER — Encounter: Payer: Self-pay | Admitting: Internal Medicine

## 2017-04-10 ENCOUNTER — Other Ambulatory Visit: Payer: Self-pay

## 2017-04-10 DIAGNOSIS — F909 Attention-deficit hyperactivity disorder, unspecified type: Secondary | ICD-10-CM

## 2017-04-10 NOTE — Telephone Encounter (Signed)
Pt left v/m requesting rx dexmethylphenidate. Call when ready for pick up. Last printed # 60 on 03/20/17. Last annual 10/27/16 and 6 mth f/u on 05/02/17. (? Early request.)

## 2017-04-11 NOTE — Telephone Encounter (Signed)
Pt states she knows what happened, the pills were given in 2 separate bottles... I let pt know she can pick Rx up next week--- she expressed understanding

## 2017-04-11 NOTE — Telephone Encounter (Signed)
How will she be out, RX is not due until 5/9?

## 2017-04-11 NOTE — Telephone Encounter (Signed)
Pt called about rx, stating that tomorrow morning she will be completely out

## 2017-04-13 MED ORDER — DEXMETHYLPHENIDATE HCL 10 MG PO TABS
10.0000 mg | ORAL_TABLET | Freq: Two times a day (BID) | ORAL | 0 refills | Status: DC
Start: 2017-04-13 — End: 2017-05-12

## 2017-04-13 NOTE — Telephone Encounter (Signed)
Please print for me to sign

## 2017-04-13 NOTE — Telephone Encounter (Signed)
Rx left in front office for pick up

## 2017-04-24 ENCOUNTER — Telehealth: Payer: Self-pay

## 2017-04-24 ENCOUNTER — Ambulatory Visit (INDEPENDENT_AMBULATORY_CARE_PROVIDER_SITE_OTHER): Payer: Managed Care, Other (non HMO) | Admitting: Internal Medicine

## 2017-04-24 ENCOUNTER — Encounter: Payer: Self-pay | Admitting: Internal Medicine

## 2017-04-24 VITALS — BP 158/104 | HR 77 | Temp 97.9°F | Wt 191.0 lb

## 2017-04-24 DIAGNOSIS — J301 Allergic rhinitis due to pollen: Secondary | ICD-10-CM | POA: Diagnosis not present

## 2017-04-24 DIAGNOSIS — I10 Essential (primary) hypertension: Secondary | ICD-10-CM | POA: Diagnosis not present

## 2017-04-24 MED ORDER — LISINOPRIL-HYDROCHLOROTHIAZIDE 10-12.5 MG PO TABS: 1.0000 | ORAL_TABLET | Freq: Every day | ORAL | 0 refills | Status: DC

## 2017-04-24 NOTE — Telephone Encounter (Signed)
Pt has appt with R Baity NP 04/24/17 at 10 AM.

## 2017-04-24 NOTE — Telephone Encounter (Signed)
PLEASE NOTE: All timestamps contained within this report are represented as [place] Standard Time. CONFIDENTIALTY NOTICE: This fax transmission is intended only for the addressee. It contains information that is legally privileged, confidential or otherwise protected from use or disclosure. If you are not the intended recipient, you are strictly prohibited from reviewing, disclosing, copying using or disseminating any of this information or taking any action in reliance on or regarding this information. If you have received this fax in error, please notify us immediately by telephone so that we can arrange for its return to [place]. Phone: [phone], Toll-Free: [phone], Fax: [phone] Page: 1 of 2 Call Id: [id number] Stanton Patient Name: Teresa Villarreal Gender: Female DOB: [birth year] Age: 46 Y 27 M 25 D Return Phone Number: [phone] (Primary), [phone] (Secondary) City/State/Zip: [place] [address] Client [facility] - Client Client Site [place] - Day Physician Webb Silversmith - NP Who Is Calling Patient / Member / Family / Caregiver Call Type Triage / Clinical Relationship To Patient Self Return Phone Number [phone] (Primary) Chief Complaint Heart palpitations or irregular heartbeat Reason for Call Symptomatic / Request for [name] states her blood pressure 180/103. She has a so called fluttering feeling in her chest. She has had a headache the past two days. Her neck is sore. She is on Dexmethylphendan 10mg  Appointment Disposition EMR Appointment Not Necessary Info pasted into Epic No Nurse Assessment Nurse: Jimmye Norman, RN, Olin Hauser Date/Time Eilene Ghazi Time): 04/23/2017 4:53:29 PM Confirm and document reason for call. If symptomatic, describe symptoms. ---caller states bp up 180/103 today headaches for 2  days and fluttering in the chest x 1 today. takes dexmethylphendan for ADHD feels tired Does the PT have any chronic conditions? (i.e. diabetes, asthma, etc.) ---Yes List chronic conditions. ---adhd, anxiety and depression Is the patient pregnant or possibly pregnant? (Ask all females between the ages of 48-55) ---No Guidelines Guideline Title Affirmed Question High Blood Pressure Systolic BP >= 884 OR Diastolic >= 166 Disp. Time Eilene Ghazi Time) Disposition Final User 04/23/2017 5:02:11 PM See Physician within 24 Hours Yes Jimmye Norman, RN, Olin Hauser Referrals REFERRED TO PCP OFFICE Care Advice Given Per Guideline SEE PHYSICIAN WITHIN 24 HOURS: * IF OFFICE WILL BE OPEN: You need to be seen within the next 24 hours. Call your doctor when the office opens, and make an appointment. * IF OFFICE WILL BE CLOSED AND NO PCP TRIAGE: You need to be seen within the next 24 hours. An urgent care center is often a good source of care if your doctor's office is closed. HIGH BLOOD PRESSURE: * Treatment of high blood pressure can reduce the risk of stroke, heart attack, and heart failure. CALL BACK IF: * Weakness or numbness of the face, arm or leg on one side of the body occurs * Difficulty walking, difficulty talking, or severe headache occurs * Chest pain or difficulty breathing occurs * You become worse. * Untreated high blood pressure may cause damage to your heart, brain, kidneys, and eyes. CARE ADVICE given per High Blood Pressure (Adult) guideline. PLEASE NOTE: All timestamps contained within this report are represented as [place] Standard Time. CONFIDENTIALTY NOTICE: This fax transmission is intended only for the addressee. It contains information that is legally privileged, confidential or otherwise protected from use or disclosure. If you are not the intended recipient, you are strictly prohibited from reviewing, disclosing, copying using or disseminating any  of this information or taking any action in  reliance on or regarding this information. If you have received this fax in error, please notify us immediately by telephone so that we can arrange for its return to [place]. Phone: [phone], Toll-Free: [phone], Fax: [phone] Page: 2 of 2 Call Id: [id number] Comments User: Tempie Donning, RN Date/Time Eilene Ghazi Time): 04/23/2017 4:57:04 PM bp 10 min ago 176/110 57 User: Tempie Donning, RN Date/Time (Eastern Time): 04/23/2017 5:03:55 PM states will call office in morning.

## 2017-04-24 NOTE — Progress Notes (Signed)
Subjective:    Patient ID: Teresa Villarreal, female    DOB: [birth year], 46 y.o.   MRN: [id number]  HPI  Pt presents to the clinic today with c/o elevated blood pressure. She reports yesterday, she wasn't feeling well. She decided to check her blood pressure. It was 202/110. She has never checked her blood pressure prior to this. Her BP at her prior visits was 160/98, 132/84, 130/80. Her BP today is 158/104. She has been having headaches, and lightheadedness. She denies visual changes, chest pain or shortness of breath. She has been having some sinus issues, so that is where she thought the headaches were coming from. She has not been taking anything OTC for her sinus issues. She is on Focalin for ADHD and reports she does not feel like this is something she can stop at this time.  Review of Systems      Past Medical History:  Diagnosis Date  . Chicken pox   . Depression   . Frequent headaches   . Genital warts   . History of kidney stones   . Hyperlipidemia     Current Outpatient Prescriptions  Medication Sig Dispense Refill  . ALPRAZolam (XANAX) 0.25 MG tablet Take 1 tablet (0.25 mg total) by mouth 3 (three) times daily as needed for anxiety. 30 tablet 0  . buPROPion (WELLBUTRIN XL) 300 MG 24 hr tablet TAKE 1 TABLET (300 MG TOTAL) BY MOUTH DAILY. 90 tablet 1  . dexmethylphenidate (FOCALIN) 10 MG tablet Take 1 tablet (10 mg total) by mouth 2 (two) times daily. 60 tablet 0  . escitalopram (LEXAPRO) 20 MG tablet TAKE 1 TABLET (20 MG TOTAL) BY MOUTH DAILY. 90 tablet 0  . fluticasone (FLONASE) 50 MCG/ACT nasal spray Place 2 sprays into both nostrils daily. 16 g 6  . ibuprofen (ADVIL,MOTRIN) 200 MG tablet Take 200 mg by mouth as needed.    . pseudoephedrine (SUDAFED) 30 MG tablet Take 30 mg by mouth as needed for congestion.    [name] Kitchen lisinopril-hydrochlorothiazide (PRINZIDE,ZESTORETIC) 10-12.5 MG tablet Take 1 tablet by mouth daily. 30 tablet 0   No current facility-administered medications  for this visit.     No Known Allergies  Family History  Problem Relation Age of Onset  . Hyperlipidemia Mother   . Heart disease Mother   . Hypertension Mother   . Alzheimer's disease Mother   . COPD Mother   . Hyperlipidemia Father   . Hypertension Father   . Mitochondrial disorder Brother        Mitochondrial encephalomyopathy lactic acidosis  . Breast cancer Paternal Aunt   . Arthritis Maternal Grandmother     Social History   Social History  . Marital status: Married    Spouse name: N/A  . Number of children: N/A  . Years of education: N/A   Occupational History  . Not on file.   Social History Main Topics  . Smoking status: Never Smoker  . Smokeless tobacco: Never Used  . Alcohol use 0.0 oz/week     Comment: occasional  . Drug use: No  . Sexual activity: Yes   Other Topics Concern  . Not on file   Social History Narrative  . No narrative on file     Constitutional: Pt reports headaches. Denies fever, malaise, fatigue, or abrupt weight changes.  HEENT: Pt reports nasal congestion. Denies eye pain, eye redness, ear pain, ringing in the ears, wax buildup, runny nose, bloody nose, or sore throat. Respiratory: Denies difficulty breathing, shortness of  breath, cough or sputum production.   Cardiovascular: Denies chest pain, chest tightness, palpitations or swelling in the hands or feet.  Neurological: Pt reports lightheadedness. Denies dizziness, difficulty with memory, difficulty with speech or problems with balance and coordination.    No other specific complaints in a complete review of systems (except as listed in HPI above).  Objective:   Physical Exam   BP (!) 158/104   Pulse 77   Temp 97.9 F (36.6 C) (Oral)   Wt 191 lb (86.6 kg)   LMP 04/08/2017   SpO2 97%   BMI 34.93 kg/m  Wt Readings from Last 3 Encounters:  04/24/17 191 lb (86.6 kg)  11/15/16 195 lb (88.5 kg)  10/27/16 195 lb 12 oz (88.8 kg)    General: Appears her stated age, obese  in NAD. HEENT: Head: normal shape and size; Eyes: PERRLA and EOMs intact; Ears: Tm's gray and intact, normal light reflex; Nose: mucosa pink and moist, septum midline; Throat/Mouth: Teeth present, mucosa pink and moist, no exudate, lesions or ulcerations noted.  Cardiovascular: Normal rate and rhythm. S1,S2 noted.  No murmur, rubs or gallops noted. Pulmonary/Chest: Normal effort and positive vesicular breath sounds. No respiratory distress. No wheezes, rales or ronchi noted.  Neurological: Alert and oriented.    BMET    Component Value Date/Time   NA 140 10/27/2016 1129   K 3.6 10/27/2016 1129   CL 101 10/27/2016 1129   CO2 32 10/27/2016 1129   GLUCOSE 95 10/27/2016 1129   BUN 10 10/27/2016 1129   CREATININE 0.83 10/27/2016 1129   CALCIUM 9.3 10/27/2016 1129    Lipid Panel     Component Value Date/Time   CHOL 242 (H) 10/27/2016 1129   TRIG 177.0 (H) 10/27/2016 1129   HDL 47.20 10/27/2016 1129   CHOLHDL 5 10/27/2016 1129   VLDL 35.4 10/27/2016 1129   LDLCALC 159 (H) 10/27/2016 1129    CBC    Component Value Date/Time   WBC 6.7 10/27/2016 1129   RBC 4.07 10/27/2016 1129   HGB 11.9 (L) 10/27/2016 1129   HCT 35.7 (L) 10/27/2016 1129   PLT 292.0 10/27/2016 1129   MCV 87.8 10/27/2016 1129   MCH 28.3 07/22/2016 1459   MCHC 33.3 10/27/2016 1129   RDW 14.5 10/27/2016 1129   LYMPHSABS 1,876 07/22/2016 1459   MONOABS 536 07/22/2016 1459   EOSABS 134 07/22/2016 1459   BASOSABS 67 07/22/2016 1459    Hgb A1C Lab Results  Component Value Date   HGBA1C 4.7 07/30/2015           Assessment & Plan:   Allergic Rhinitis:  Start Flonase OTC  HTN:  Start Lisinopril HCT  Encouraged her to consume a low slat diet and increase aerobic exercise  RTC in 3 weeks for follow up of HTN Felisia Balcom, NP

## 2017-04-24 NOTE — Patient Instructions (Signed)
Hypertension °Hypertension is another name for high blood pressure. High blood pressure forces your heart to work harder to pump blood. This can cause problems over time. °There are two numbers in a blood pressure reading. There is a top number (systolic) over a bottom number (diastolic). It is best to have a blood pressure below 120/80. Healthy choices can help lower your blood pressure. You may need medicine to help lower your blood pressure if: °· Your blood pressure cannot be lowered with healthy choices. °· Your blood pressure is higher than 130/80. °Follow these instructions at home: °Eating and drinking  °· If directed, follow the DASH eating plan. This diet includes: °¨ Filling half of your plate at each meal with fruits and vegetables. °¨ Filling one quarter of your plate at each meal with whole grains. Whole grains include whole wheat pasta, brown rice, and whole grain bread. °¨ Eating or drinking low-fat dairy products, such as skim milk or low-fat yogurt. °¨ Filling one quarter of your plate at each meal with low-fat (lean) proteins. Low-fat proteins include fish, skinless chicken, eggs, beans, and tofu. °¨ Avoiding fatty meat, cured and processed meat, or chicken with skin. °¨ Avoiding premade or processed food. °· Eat less than 1,500 mg of salt (sodium) a day. °· Limit alcohol use to no more than 1 drink a day for nonpregnant women and 2 drinks a day for men. One drink equals 12 oz of beer, 5 oz of wine, or 1½ oz of hard liquor. °Lifestyle  °· Work with your doctor to stay at a healthy weight or to lose weight. Ask your doctor what the best weight is for you. °· Get at least 30 minutes of exercise that causes your heart to beat faster (aerobic exercise) most days of the week. This may include walking, swimming, or biking. °· Get at least 30 minutes of exercise that strengthens your muscles (resistance exercise) at least 3 days a week. This may include lifting weights or pilates. °· Do not use any  products that contain nicotine or tobacco. This includes cigarettes and e-cigarettes. If you need help quitting, ask your doctor. °· Check your blood pressure at home as told by your doctor. °· Keep all follow-up visits as told by your doctor. This is important. °Medicines  °· Take over-the-counter and prescription medicines only as told by your doctor. Follow directions carefully. °· Do not skip doses of blood pressure medicine. The medicine does not work as well if you skip doses. Skipping doses also puts you at risk for problems. °· Ask your doctor about side effects or reactions to medicines that you should watch for. °Contact a doctor if: °· You think you are having a reaction to the medicine you are taking. °· You have headaches that keep coming back (recurring). °· You feel dizzy. °· You have swelling in your ankles. °· You have trouble with your vision. °Get help right away if: °· You get a very bad headache. °· You start to feel confused. °· You feel weak or numb. °· You feel faint. °· You get very bad pain in your: °¨ Chest. °¨ Belly (abdomen). °· You throw up (vomit) more than once. °· You have trouble breathing. °Summary °· Hypertension is another name for high blood pressure. °· Making healthy choices can help lower blood pressure. If your blood pressure cannot be controlled with healthy choices, you may need to take medicine. °This information is not intended to replace advice given to you by your   health care provider. Make sure you discuss any questions you have with your health care provider. °Document Released: 05/16/2008 Document Revised: 10/26/2016 Document Reviewed: 10/26/2016 °Elsevier Interactive Patient Education © 2017 [employer] ° °

## 2017-04-25 ENCOUNTER — Telehealth: Payer: Self-pay

## 2017-04-25 NOTE — Telephone Encounter (Signed)
Pt was seen 04/24/17 and started lisinopril HCTZ; today BP averaging 135 - 140 / 88 - 90. But pt h/a is no better; pt feels like her head is in a vice; pain level now 6. Tylenol and Ibuprofen not helping h/a. Pt is concerned about a stroke or heart attack; pt request cb with what to do next.

## 2017-04-25 NOTE — Telephone Encounter (Signed)
She needs to give it more time. Her BP is not high enough for her to have a stroke or heart attack. She is making herself anxious which is not helping her BP or headache. I would advise her to stop taking her BP at home for now. We can either try some low dose Flexeril for her headache or I can give her a RX for Tylenol #3. Let me know what she prefers.

## 2017-04-25 NOTE — Telephone Encounter (Signed)
Pt states she thinks she may have leftover Flexeril but did not want to take anything that would make her drowsy.... Also not wanting to take any additional meds at this time... Pt will stop checking BP at home and will call back if she changes her mind in reference to Rx for headaches

## 2017-04-26 NOTE — Telephone Encounter (Signed)
noted 

## 2017-05-02 ENCOUNTER — Telehealth: Payer: Self-pay | Admitting: Internal Medicine

## 2017-05-02 ENCOUNTER — Ambulatory Visit: Payer: Managed Care, Other (non HMO) | Admitting: Internal Medicine

## 2017-05-02 NOTE — Telephone Encounter (Signed)
Pt has appt on 05/03/17 at 10:30 with Dr. Thersa Salt by [facility]. (no available appts at [facility] on 05/03/17)

## 2017-05-02 NOTE — Telephone Encounter (Signed)
[facility]  Patient Name: Teresa Villarreal   DOB: 09-03-1971    Initial Comment Caller states that she saw pcp last monday for high blood pressure, it has since gone down but she still has the headache from it.    Nurse Assessment  Nurse: Harlow Mares, RN, Suanne Marker Date/Time (Eastern Time): 05/02/2017 4:40:16 PM  Confirm and document reason for call. If symptomatic, describe symptoms. ---Caller states that she saw pcp last Monday for high blood pressure, it has since gone down but she still has the headache from it. Reports that her headache began last thrus or friday of last week. Noted elevated bp on Sunday. Monday was taken off of her Focalin and Sudafed, and is taking Wellbutrin, Lexapro, and Lisinopril. Reports headache continues, sometimes worse than others. Not sure if this is related to sinuses. BP is 125/75. Reports some sinus pressure and sinus drainage.  Does the patient have any new or worsening symptoms? ---Yes  Will a triage be completed? ---Yes  Related visit to physician within the last 2 weeks? ---Yes  Does the PT have any chronic conditions? (i.e. diabetes, asthma, etc.) ---Yes  List chronic conditions. ---ADD; HTN; sinus problems; anxiety/ depression  Is the patient pregnant or possibly pregnant? (Ask all females between the ages of 35-55) ---No  Is this a behavioral health or substance abuse call? ---No     Guidelines    Guideline Title Affirmed Question Affirmed Notes  Headache [1] SEVERE headache (e.g., excruciating) AND [2] "worst headache" of life    Final Disposition User   Go to ED Now (or PCP triage) Harlow Mares, RN, Rhonda    Comments  Caller scheduled for appt at [facility] for 10:15am with Truddie Coco tomorrow am. Caller voiced understanding. No available appts at [facility].   Referrals  REFERRED TO PCP OFFICE   Disagree/Comply: Disagree  Disagree/Comply Reason: Wait  and see

## 2017-05-02 NOTE — Telephone Encounter (Signed)
Is there a reason that this can't be handled by PCP. Her BP is at goal and she has a headache.  This is not urgent. Can be treated by PCP.

## 2017-05-03 ENCOUNTER — Ambulatory Visit: Payer: Self-pay | Admitting: Family Medicine

## 2017-05-03 NOTE — Telephone Encounter (Signed)
Dr. Lacinda Axon wanted me to further look into this patient as it looks like you just saw her and I see some notes on 5/15 regarding her headache not being resolved.  He didn't know if you wanted to handle your patient without a visit as this is a issue that you had spoken with her about? Note from 5/15 stated that you offered flexeril or tylenol #3 and patient declined.  He is not wanting her to come in for a unneeded visit.

## 2017-05-03 NOTE — Telephone Encounter (Signed)
FYI - Pt called and cancelled appt. Pt states that she is just not coming today.

## 2017-05-03 NOTE — Telephone Encounter (Signed)
I will address it

## 2017-05-03 NOTE — Telephone Encounter (Signed)
Noted, thanks!

## 2017-05-12 ENCOUNTER — Ambulatory Visit (INDEPENDENT_AMBULATORY_CARE_PROVIDER_SITE_OTHER): Payer: Managed Care, Other (non HMO) | Admitting: Internal Medicine

## 2017-05-12 ENCOUNTER — Encounter: Payer: Self-pay | Admitting: Internal Medicine

## 2017-05-12 VITALS — BP 120/80 | HR 76 | Temp 98.1°F | Wt 192.8 lb

## 2017-05-12 DIAGNOSIS — F900 Attention-deficit hyperactivity disorder, predominantly inattentive type: Secondary | ICD-10-CM | POA: Diagnosis not present

## 2017-05-12 DIAGNOSIS — I10 Essential (primary) hypertension: Secondary | ICD-10-CM | POA: Insufficient documentation

## 2017-05-12 DIAGNOSIS — R519 Headache, unspecified: Secondary | ICD-10-CM

## 2017-05-12 DIAGNOSIS — F988 Other specified behavioral and emotional disorders with onset usually occurring in childhood and adolescence: Secondary | ICD-10-CM | POA: Diagnosis not present

## 2017-05-12 DIAGNOSIS — R51 Headache: Secondary | ICD-10-CM | POA: Diagnosis not present

## 2017-05-12 LAB — BASIC METABOLIC PANEL
BUN: 20 mg/dL (ref 6–23)
CHLORIDE: 101 meq/L (ref 96–112)
CO2: 28 mEq/L (ref 19–32)
Calcium: 9.1 mg/dL (ref 8.4–10.5)
Creatinine, Ser: 0.92 mg/dL (ref 0.40–1.20)
GFR: 69.66 mL/min (ref >60.00)
GLUCOSE: 96 mg/dL (ref 70–99)
POTASSIUM: 4.3 meq/L (ref 3.5–5.1)
Sodium: 136 mEq/L (ref 135–145)

## 2017-05-12 MED ORDER — LISINOPRIL-HYDROCHLOROTHIAZIDE 10-12.5 MG PO TABS: 1.0000 | ORAL_TABLET | Freq: Every day | ORAL | 2 refills | Status: DC

## 2017-05-12 MED ORDER — LISDEXAMFETAMINE DIMESYLATE 20 MG PO CAPS: 20.0000 mg | ORAL_CAPSULE | Freq: Every day | ORAL | 0 refills | Status: DC

## 2017-05-12 NOTE — Assessment & Plan Note (Signed)
Now at goal on Lisinopril HCT Refilled today BMET today  Update me in 4 weeks via mychart if BP rises with starting Vyvanse

## 2017-05-12 NOTE — Assessment & Plan Note (Signed)
Will try Vyvanse RX provided today  Update me in 4 weeks via mychart

## 2017-05-12 NOTE — Assessment & Plan Note (Signed)
?   Tension Advised her to try heat, neck stretches and massage Advised her to try Excedrin Migraine She declines referral to neuro at this time

## 2017-05-12 NOTE — Patient Instructions (Signed)

## 2017-05-12 NOTE — Progress Notes (Signed)
Subjective:    Patient ID: Teresa Villarreal, female    DOB: December 31, 1970, 46 y.o.   MRN: [id number]  HPI  Pt presents to the clinic today for 2 week follow up of HTN. She was started on Lisinopril HCT at her last visit. She has been taking the medication as prescribed. She denies adverse side effects but reports she continues to have intermittent headaches. Her BP today is 120/80.  Frequent Headaches: Located on the top of her head and base of her skull. She describes the pain as pressure. She denies dizziness, visual changes, sensitivity to light and sound. She denies nausea or vomiting. She feels like her stress is well controlled. She has tried Tylenol, Ibuprofen and Sudafed with minimal relief.   She stopped the Focalin secondary to the increased blood pressure. Since that time, she reports increased fatigue and difficulty focusing. She is interested in trying Vyvanse.  Review of Systems  Past Medical History:  Diagnosis Date  . Chicken pox   . Depression   . Frequent headaches   . Genital warts   . History of kidney stones   . Hyperlipidemia     Current Outpatient Prescriptions  Medication Sig Dispense Refill  . ALPRAZolam (XANAX) 0.25 MG tablet Take 1 tablet (0.25 mg total) by mouth 3 (three) times daily as needed for anxiety. 30 tablet 0  . buPROPion (WELLBUTRIN XL) 300 MG 24 hr tablet TAKE 1 TABLET (300 MG TOTAL) BY MOUTH DAILY. 90 tablet 1  . dexmethylphenidate (FOCALIN) 10 MG tablet Take 1 tablet (10 mg total) by mouth 2 (two) times daily. 60 tablet 0  . escitalopram (LEXAPRO) 20 MG tablet TAKE 1 TABLET (20 MG TOTAL) BY MOUTH DAILY. 90 tablet 0  . fluticasone (FLONASE) 50 MCG/ACT nasal spray Place 2 sprays into both nostrils daily. 16 g 6  . ibuprofen (ADVIL,MOTRIN) 200 MG tablet Take 200 mg by mouth as needed.    [name] Kitchen lisinopril-hydrochlorothiazide (PRINZIDE,ZESTORETIC) 10-12.5 MG tablet Take 1 tablet by mouth daily. 30 tablet 0  . pseudoephedrine (SUDAFED) 30 MG tablet Take 30  mg by mouth as needed for congestion.     No current facility-administered medications for this visit.     No Known Allergies  Family History  Problem Relation Age of Onset  . Hyperlipidemia Mother   . Heart disease Mother   . Hypertension Mother   . Alzheimer's disease Mother   . COPD Mother   . Hyperlipidemia Father   . Hypertension Father   . Mitochondrial disorder Brother        Mitochondrial encephalomyopathy lactic acidosis  . Breast cancer Paternal Aunt   . Arthritis Maternal Grandmother     Social History   Social History  . Marital status: Married    Spouse name: N/A  . Number of children: N/A  . Years of education: N/A   Occupational History  . Not on file.   Social History Main Topics  . Smoking status: Never Smoker  . Smokeless tobacco: Never Used  . Alcohol use 0.0 oz/week     Comment: occasional  . Drug use: No  . Sexual activity: Yes   Other Topics Concern  . Not on file   Social History Narrative  . No narrative on file     Constitutional: Pt reports intermittent headaches. Denies fever, malaise, fatigue, or abrupt weight changes.  Respiratory: Denies difficulty breathing, shortness of breath, cough or sputum production.   Cardiovascular: Denies chest pain, chest tightness, palpitations or swelling in the  hands or feet.  Neurological: Pt reports difficulty focusing. Denies dizziness, difficulty with memory, difficulty with speech or problems with balance and coordination.    No other specific complaints in a complete review of systems (except as listed in HPI above).     Objective:   Physical Exam   BP 120/80   Pulse 76   Temp 98.1 F (36.7 C) (Oral)   Wt 192 lb 12 oz (87.4 kg)   SpO2 98%   BMI 35.25 kg/m  Wt Readings from Last 3 Encounters:  05/12/17 192 lb 12 oz (87.4 kg)  04/24/17 191 lb (86.6 kg)  11/15/16 195 lb (88.5 kg)    General: Appears her stated age, in NAD. Cardiovascular: Normal rate and rhythm. S1,S2 noted.     Pulmonary/Chest: Normal effort and positive vesicular breath sounds. No respiratory distress. No wheezes, rales or ronchi noted.  Neurological: Alert and oriented.     BMET    Component Value Date/Time   NA 140 10/27/2016 1129   K 3.6 10/27/2016 1129   CL 101 10/27/2016 1129   CO2 32 10/27/2016 1129   GLUCOSE 95 10/27/2016 1129   BUN 10 10/27/2016 1129   CREATININE 0.83 10/27/2016 1129   CALCIUM 9.3 10/27/2016 1129    Lipid Panel     Component Value Date/Time   CHOL 242 (H) 10/27/2016 1129   TRIG 177.0 (H) 10/27/2016 1129   HDL 47.20 10/27/2016 1129   CHOLHDL 5 10/27/2016 1129   VLDL 35.4 10/27/2016 1129   LDLCALC 159 (H) 10/27/2016 1129    CBC    Component Value Date/Time   WBC 6.7 10/27/2016 1129   RBC 4.07 10/27/2016 1129   HGB 11.9 (L) 10/27/2016 1129   HCT 35.7 (L) 10/27/2016 1129   PLT 292.0 10/27/2016 1129   MCV 87.8 10/27/2016 1129   MCH 28.3 07/22/2016 1459   MCHC 33.3 10/27/2016 1129   RDW 14.5 10/27/2016 1129   LYMPHSABS 1,876 07/22/2016 1459   MONOABS 536 07/22/2016 1459   EOSABS 134 07/22/2016 1459   BASOSABS 67 07/22/2016 1459    Hgb A1C Lab Results  Component Value Date   HGBA1C 4.7 07/30/2015           Assessment & Plan:

## 2017-05-30 ENCOUNTER — Encounter: Payer: Self-pay | Admitting: Internal Medicine

## 2017-06-01 ENCOUNTER — Telehealth: Payer: Self-pay

## 2017-06-01 NOTE — Telephone Encounter (Signed)
Pt left v/m; pt has tried vyvanse which is too expensive. Pt wants to try Dextroamphetamine extended release. Pt request cb when rx ready for pick up.

## 2017-06-02 NOTE — Telephone Encounter (Signed)
We can do that on July 1st when she is due for her next refill

## 2017-06-05 MED ORDER — DEXTROAMPHETAMINE SULFATE ER 10 MG PO CP24: 10.0000 mg | ORAL_CAPSULE | Freq: Every day | ORAL | 0 refills | Status: DC

## 2017-06-05 NOTE — Telephone Encounter (Signed)
Pt states she was unable to fill previous Rx due to cost, so no she never picked up previous Rx

## 2017-06-05 NOTE — Telephone Encounter (Signed)
Did she pick up the Vyvanse on 6/1 or did she not have it filled because it was too expensive?

## 2017-06-05 NOTE — Addendum Note (Signed)
Addended by: Jearld Fenton on: 06/05/2017 03:04 PM   Modules accepted: Orders

## 2017-06-05 NOTE — Telephone Encounter (Addendum)
Pt left v/m;pt got message that pt could not get until 06/11/17;pt does not understand why has to wait until 06/11/17.pt said ins wants her to get Dextroamphetamine ER. Pt request cb.

## 2017-06-05 NOTE — Telephone Encounter (Signed)
Rx left in front office for pick up and pt is aware  

## 2017-06-05 NOTE — Telephone Encounter (Signed)
RX printed and signed and placed in MYD box 

## 2017-06-12 ENCOUNTER — Other Ambulatory Visit: Payer: Self-pay

## 2017-06-12 MED ORDER — LISINOPRIL-HYDROCHLOROTHIAZIDE 10-12.5 MG PO TABS: 1.0000 | ORAL_TABLET | Freq: Every day | ORAL | 0 refills | Status: DC

## 2017-06-16 ENCOUNTER — Other Ambulatory Visit: Payer: Self-pay | Admitting: Internal Medicine

## 2017-07-03 ENCOUNTER — Telehealth: Payer: Self-pay | Admitting: *Deleted

## 2017-07-03 NOTE — Telephone Encounter (Signed)
We have been around and around with her ADHD medication. I feel it would be best if this was managed by a specialist. Is she okay with psych referral?

## 2017-07-03 NOTE — Telephone Encounter (Signed)
Spoke to pt who states she is currently taking dexedrine but has noticed she has missed her menstrual cycle the last 63mos since starting the medication and has also experienced constipation. She was previously on focalin but it was d/c due to it increasing her BP. She is wanting to know that since she is now on a BP med, if she can restart the focalin. pls advise

## 2017-07-04 MED ORDER — DEXMETHYLPHENIDATE HCL 10 MG PO TABS: 10.0000 mg | ORAL_TABLET | Freq: Two times a day (BID) | ORAL | 0 refills | Status: DC

## 2017-07-04 NOTE — Telephone Encounter (Signed)
Rx for regular Focalin printed for your signature... Pt wants to know if she just starts the Focalin or does she have to wait a certain period of time?... Please advise

## 2017-07-04 NOTE — Addendum Note (Signed)
Addended by: Lurlean Nanny on: 07/04/2017 03:41 PM   Modules accepted: Orders

## 2017-07-04 NOTE — Telephone Encounter (Signed)
Does she want IR or XR?

## 2017-07-04 NOTE — Telephone Encounter (Signed)
Pt states that due to her insurance not covering much, she is concerned with cost. She understands and agrees but she is not able to cover the financial obligation attached to seeing psych.... Pt wants to know for now, if she could just restart the Focalin... Please advise

## 2017-07-04 NOTE — Telephone Encounter (Signed)
She does not have to wait.

## 2017-07-05 NOTE — Telephone Encounter (Signed)
I put a note in with the Rx to let pt know

## 2017-07-28 ENCOUNTER — Other Ambulatory Visit: Payer: Self-pay

## 2017-07-28 MED ORDER — LISINOPRIL-HYDROCHLOROTHIAZIDE 10-12.5 MG PO TABS: 1.0000 | ORAL_TABLET | Freq: Every day | ORAL | 0 refills | Status: DC

## 2017-08-15 ENCOUNTER — Other Ambulatory Visit: Payer: Self-pay

## 2017-08-15 NOTE — Telephone Encounter (Signed)
Pt left /vm requesting rx focalin. Call when ready for pick up. Last printed # 60 on 07/04/17. Last seen 05/12/17.

## 2017-08-16 MED ORDER — DEXMETHYLPHENIDATE HCL 10 MG PO TABS: 10.0000 mg | ORAL_TABLET | Freq: Two times a day (BID) | ORAL | 0 refills | Status: DC

## 2017-08-16 NOTE — Telephone Encounter (Signed)
RX printed and signed and placed in MYD box 

## 2017-08-16 NOTE — Telephone Encounter (Signed)
Rx left in front office for pick up and pt is aware  

## 2017-09-12 ENCOUNTER — Other Ambulatory Visit: Payer: Self-pay

## 2017-09-12 MED ORDER — DEXMETHYLPHENIDATE HCL 10 MG PO TABS: 10.0000 mg | ORAL_TABLET | Freq: Two times a day (BID) | ORAL | 0 refills | Status: DC

## 2017-09-12 NOTE — Telephone Encounter (Signed)
RX printed and signed and placed in MYD box 

## 2017-09-12 NOTE — Telephone Encounter (Signed)
Pt left v/m requesting rx dexmethylphenidate. Call when ready for pick up. Pt request cb ASAP. Last printed # 60 on 08/16/17; last seen 05/12/17

## 2017-09-14 ENCOUNTER — Other Ambulatory Visit: Payer: Self-pay | Admitting: Internal Medicine

## 2017-10-09 ENCOUNTER — Encounter: Payer: Self-pay | Admitting: Family Medicine

## 2017-10-09 ENCOUNTER — Ambulatory Visit (INDEPENDENT_AMBULATORY_CARE_PROVIDER_SITE_OTHER): Payer: Managed Care, Other (non HMO) | Admitting: Family Medicine

## 2017-10-09 VITALS — BP 122/78 | HR 80 | Temp 97.9°F | Ht 62.0 in | Wt 190.5 lb

## 2017-10-09 DIAGNOSIS — M7752 Other enthesopathy of left foot: Secondary | ICD-10-CM

## 2017-10-09 NOTE — Progress Notes (Signed)
Dr. Frederico Hamman T. Crissy Mccreadie, MD, [facility] [place], [address] Phone: [phone] Fax: [phone]  10/09/2017  Patient: Teresa Villarreal, MRN: [id number], DOB: Nov 08, 1971, 46 y.o.  Primary Physician:  Jearld Fenton, NP   Chief Complaint  Patient presents with  . Foot/Toe Pain    ?Turf Toe x 3 weeks   Subjective:   Teresa Villarreal is a 46 y.o. very pleasant female patient who presents with the following:  3 weeks. Friend who is a PT. Does not exercise.  Took some NSAIDs, and these have not helped all that much. She has primarily pain on the joint line on the first MTP on the left. She does think she had an extension episode where she was sleeping in forced extension.  Past Medical History, Surgical History, Social History, Family History, Problem List, Medications, and Allergies have been reviewed and updated if relevant.  Patient Active Problem List   Diagnosis Date Noted  . Essential hypertension 05/12/2017  . 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 Anxiety and depression 07/29/2015  . Frequent headaches 07/29/2015  . HLD (hyperlipidemia) 07/29/2015    Past Medical History:  Diagnosis Date  . Chicken pox   . Depression   . Frequent headaches   . Genital warts   . History of kidney stones   . Hyperlipidemia     Past Surgical History:  Procedure Laterality Date  . CYSTOSCOPY  2010  . HYSTEROSCOPY  2010  . KNEE ARTHROSCOPY Left 1990    Social History   Social History  . Marital status: Married    Spouse name: N/A  . Number of children: N/A  . Years of education: N/A   Occupational History  . Not on file.   Social History Main Topics  . Smoking status: Never Smoker  . Smokeless tobacco: Never Used  . Alcohol use 0.0 oz/week     Comment: occasional  . Drug use: No  . Sexual activity: Yes   Other Topics Concern  . Not on file   Social  History Narrative  . No narrative on file    Family History  Problem Relation Age of Onset  . Hyperlipidemia Mother   . Heart disease Mother   . Hypertension Mother   . Alzheimer's disease Mother   . COPD Mother   . Hyperlipidemia Father   . Hypertension Father   . Mitochondrial disorder Brother        Mitochondrial encephalomyopathy lactic acidosis  . Breast cancer Paternal Aunt   . Arthritis Maternal Grandmother     No Known Allergies  Medication list reviewed and updated in full in [place].  GEN: No fevers, chills. Nontoxic. Primarily MSK c/o today. MSK: Detailed in the HPI GI: tolerating PO intake without difficulty Neuro: No numbness, parasthesias, or tingling associated. Otherwise the pertinent positives of the ROS are noted above.   Objective:   BP 122/78   Pulse 80   Temp 97.9 F (36.6 C) (Oral)   Ht 5\' 2"  (1.575 m)   Wt 190 lb 8 oz (86.4 kg)   LMP 09/20/2017   BMI 34.84 kg/m    GEN: WDWN, NAD, Non-toxic, Alert & Oriented x 3 HEENT: Atraumatic, Normocephalic.  Ears and Nose: No external deformity. EXTR: No clubbing/cyanosis/edema NEURO: Normal gait.  PSYCH: Normally interactive. Conversant. Not depressed or anxious appearing.  Calm demeanor.   FEET: L Echymosis: no Edema: no ROM: full LE  B Gait: heel toe, non-antalgic MT pain: no Callus pattern: none Lateral Mall: NT Medial Mall: NT Talus: NT Navicular: NT Cuboid: NT Calcaneous: NT Metatarsals: NT 5th MT: NT Phalanges: NT Achilles: NT Plantar Fascia: NT Fat Pad: NT Peroneals: NT Post Tib: NT Great Toe: TTP on 1st MTP and decreased ROM Ant Drawer: neg ATFL: NT CFL: NT Deltoid: NT Other foot breakdown: none Long arch: preserved Transverse arch: preserved Hindfoot breakdown: none Sensation: intact   Radiology: No results found.  Assessment and Plan:   Capsulitis of toe of left foot  Postoperative shoe times 2 weeks.  Also gave the patient a poron metatarsal bar to  help cushion this area and recommended she get a new pair of shoes that are more supportive in the mid foot to forefoot.  Follow-up: if not better in 1 mo  Medications Discontinued During This Encounter  Medication Reason  . dextroamphetamine (DEXEDRINE SPANSULE) 10 MG 24 hr capsule Change in therapy   Signed,  Gifford Ballon T. Tima Curet, MD   Allergies as of 10/09/2017   No Known Allergies     Medication List       Accurate as of 10/09/17  1:58 PM. Always use your most recent med list.          buPROPion 300 MG 24 hr tablet Commonly known as:  WELLBUTRIN XL TAKE 1 TABLET (300 MG TOTAL) BY MOUTH DAILY.   dexmethylphenidate 10 MG tablet Commonly known as:  FOCALIN Take 1 tablet (10 mg total) by mouth 2 (two) times daily.   escitalopram 20 MG tablet Commonly known as:  LEXAPRO Take 1 tablet (20 mg total) by mouth daily. MUST SCHEDULE ANNUAL PHYSICAL   fluticasone 50 MCG/ACT nasal spray Commonly known as:  FLONASE Place 2 sprays into both nostrils daily.   ibuprofen 200 MG tablet Commonly known as:  ADVIL,MOTRIN Take 200 mg by mouth as needed.   lisinopril-hydrochlorothiazide 10-12.5 MG tablet Commonly known as:  PRINZIDE,ZESTORETIC Take 1 tablet by mouth daily.   pseudoephedrine 30 MG tablet Commonly known as:  SUDAFED Take 30 mg by mouth as needed for congestion.

## 2017-10-10 ENCOUNTER — Encounter: Payer: Self-pay | Admitting: Family Medicine

## 2017-10-11 ENCOUNTER — Ambulatory Visit: Payer: Self-pay | Admitting: Family Medicine

## 2017-10-16 ENCOUNTER — Other Ambulatory Visit: Payer: Self-pay | Admitting: Internal Medicine

## 2017-10-16 NOTE — Telephone Encounter (Signed)
Last filled 09/12/17... Please advise

## 2017-10-17 MED ORDER — DEXMETHYLPHENIDATE HCL 10 MG PO TABS: 10.0000 mg | ORAL_TABLET | Freq: Two times a day (BID) | ORAL | 0 refills | Status: DC

## 2017-10-17 NOTE — Telephone Encounter (Signed)
RX printed and signed and placed in MYD box 

## 2017-10-17 NOTE — Telephone Encounter (Signed)
Rx left in front office for pick up and pt is aware via mychart

## 2017-11-21 ENCOUNTER — Other Ambulatory Visit: Payer: Self-pay | Admitting: Internal Medicine

## 2017-11-22 MED ORDER — DEXMETHYLPHENIDATE HCL 10 MG PO TABS: 10.0000 mg | ORAL_TABLET | Freq: Two times a day (BID) | ORAL | 0 refills | Status: DC

## 2017-11-22 NOTE — Telephone Encounter (Signed)
Relation to pt: self Call back number: [phone] Pharmacy: [id number] - [place], [place] [phone] (Phone) [phone] (Fax)     Reason for call:  Patient checking on the status of mychart message, advised patient practice was closed Monday and Tuesday and delay opening today. Patient states she will run out tomorrow 11/23/17 and would like to know if Rx can be phoned in and if not please call when ready to pick up.    Rx- dexmethylphenidate (FOCALIN) 10 MG tablet

## 2017-11-22 NOTE — Telephone Encounter (Signed)
RX printed and signed and placed in MYD box 

## 2017-11-22 NOTE — Telephone Encounter (Signed)
Last filled 10/17/17... Please advise

## 2017-11-22 NOTE — Telephone Encounter (Signed)
Rx left in front office for pick up and pt is aware via mychart

## 2017-12-24 ENCOUNTER — Other Ambulatory Visit: Payer: Self-pay | Admitting: Internal Medicine

## 2017-12-25 MED ORDER — DEXMETHYLPHENIDATE HCL 10 MG PO TABS: 10.0000 mg | ORAL_TABLET | Freq: Two times a day (BID) | ORAL | 0 refills | Status: DC

## 2017-12-25 NOTE — Telephone Encounter (Signed)
Last filled 11/22/17... Please advise

## 2017-12-28 ENCOUNTER — Other Ambulatory Visit: Payer: Self-pay | Admitting: Internal Medicine

## 2018-01-03 ENCOUNTER — Encounter: Payer: Self-pay | Admitting: Internal Medicine

## 2018-01-15 ENCOUNTER — Other Ambulatory Visit: Payer: Self-pay | Admitting: Internal Medicine

## 2018-01-15 NOTE — Telephone Encounter (Signed)
Electronic refill request Last office visit 05/12/17 Last refill 12/28/17, note was sent to patient to scheduled physical No upcoming appointment scheduled

## 2018-01-16 NOTE — Telephone Encounter (Signed)
Call pt, she needs to schedule her annual exam

## 2018-01-17 ENCOUNTER — Encounter: Payer: Self-pay | Admitting: Internal Medicine

## 2018-01-18 MED ORDER — LISINOPRIL-HYDROCHLOROTHIAZIDE 10-12.5 MG PO TABS: 1.0000 | ORAL_TABLET | Freq: Every day | ORAL | 0 refills | Status: DC

## 2018-01-23 NOTE — Telephone Encounter (Signed)
Pt is aware via my-chart.

## 2018-02-01 ENCOUNTER — Ambulatory Visit (INDEPENDENT_AMBULATORY_CARE_PROVIDER_SITE_OTHER): Payer: Managed Care, Other (non HMO) | Admitting: Family Medicine

## 2018-02-01 ENCOUNTER — Encounter: Payer: Self-pay | Admitting: Family Medicine

## 2018-02-01 VITALS — BP 130/74 | HR 103 | Temp 98.6°F | Ht 62.0 in | Wt 184.4 lb

## 2018-02-01 DIAGNOSIS — R52 Pain, unspecified: Secondary | ICD-10-CM | POA: Diagnosis not present

## 2018-02-01 LAB — POC INFLUENZA A&B (BINAX/QUICKVUE)
Influenza A, POC: POSITIVE — AB
Influenza B, POC: NEGATIVE

## 2018-02-01 MED ORDER — OSELTAMIVIR PHOSPHATE 75 MG PO CAPS: 75.0000 mg | ORAL_CAPSULE | Freq: Two times a day (BID) | ORAL | 0 refills | Status: DC

## 2018-02-01 NOTE — Progress Notes (Signed)
SUBJECTIVE:  Teresa Villarreal is a 47 y.o. female who present complaining of flu-like symptoms: fevers, chills, myalgias, congestion, and cough for 2 days. Denies dyspnea or wheezing.  Niece tested positive for Influenza A yesterday.  Her 10 year daughter also has a sore throat. Current Outpatient Medications on File Prior to Visit  Medication Sig Dispense Refill  . buPROPion (WELLBUTRIN XL) 300 MG 24 hr tablet Take 1 tablet (300 mg total) by mouth daily. MUST SCHEDULE ANNUAL PHYSICAL 90 tablet 0  . dexmethylphenidate (FOCALIN) 10 MG tablet Take 1 tablet (10 mg total) by mouth 2 (two) times daily. 60 tablet 0  . escitalopram (LEXAPRO) 20 MG tablet TAKE 1 TABLET (20 MG TOTAL) BY MOUTH DAILY. **MUST SCHEDULE ANNUAL PHYSICAL** 30 tablet 0  . fluticasone (FLONASE) 50 MCG/ACT nasal spray Place 2 sprays into both nostrils daily. 16 g 6  . ibuprofen (ADVIL,MOTRIN) 200 MG tablet Take 200 mg by mouth as needed.    [name] Kitchen lisinopril-hydrochlorothiazide (PRINZIDE,ZESTORETIC) 10-12.5 MG tablet Take 1 tablet by mouth daily. 90 tablet 0  . pseudoephedrine (SUDAFED) 30 MG tablet Take 30 mg by mouth as needed for congestion.     No current facility-administered medications on file prior to visit.     No Known Allergies  Past Medical History:  Diagnosis Date  . Chicken pox   . Depression   . Frequent headaches   . Genital warts   . History of kidney stones   . Hyperlipidemia     Past Surgical History:  Procedure Laterality Date  . CYSTOSCOPY  2010  . HYSTEROSCOPY  2010  . KNEE ARTHROSCOPY Left 1990    Family History  Problem Relation Age of Onset  . Hyperlipidemia Mother   . Heart disease Mother   . Hypertension Mother   . Alzheimer's disease Mother   . COPD Mother   . Hyperlipidemia Father   . Hypertension Father   . Mitochondrial disorder Brother        Mitochondrial encephalomyopathy lactic acidosis  . Breast cancer Paternal Aunt   . Arthritis Maternal Grandmother     Social History    Socioeconomic History  . Marital status: Married    Spouse name: Not on file  . Number of children: Not on file  . Years of education: Not on file  . Highest education level: Not on file  Social Needs  . Financial resource strain: Not on file  . Food insecurity - worry: Not on file  . Food insecurity - inability: Not on file  . Transportation needs - medical: Not on file  . Transportation needs - non-medical: Not on file  Occupational History  . Not on file  Tobacco Use  . Smoking status: Never Smoker  . Smokeless tobacco: Never Used  Substance and Sexual Activity  . Alcohol use: Yes    Alcohol/week: 0.0 oz    Comment: occasional  . Drug use: No  . Sexual activity: Yes  Other Topics Concern  . Not on file  Social History Narrative  . Not on file   The PMH, PSH, Social History, Family History, Medications, and allergies have been reviewed in [facility], and have been updated if relevant.  OBJECTIVE: BP 130/74 (BP Location: Left Arm, Patient Position: Sitting, Cuff Size: Normal)   Pulse (!) 103   Temp 98.6 F (37 C) (Oral)   Ht 5\' 2"  (1.575 m)   Wt 184 lb 6.4 oz (83.6 kg)   LMP 12/14/2017   SpO2 98%   BMI 33.73  kg/m   Appears moderately ill but not toxic; temperature as noted in vitals. Ears normal. Throat and pharynx normal.  Neck supple. No adenopathy in the neck. Sinuses non tender. The chest is clear.  ASSESSMENT: Influenza  PLAN: Symptomatic therapy suggested: call prn if symptoms persist or worsen. Call or return to clinic prn if these symptoms worsen or fail to improve as anticipated.

## 2018-02-01 NOTE — Patient Instructions (Signed)

## 2018-02-17 ENCOUNTER — Other Ambulatory Visit: Payer: Self-pay | Admitting: Internal Medicine

## 2018-02-21 ENCOUNTER — Other Ambulatory Visit: Payer: Self-pay | Admitting: Internal Medicine

## 2018-03-01 ENCOUNTER — Encounter: Payer: Self-pay | Admitting: Family Medicine

## 2018-03-01 ENCOUNTER — Ambulatory Visit (INDEPENDENT_AMBULATORY_CARE_PROVIDER_SITE_OTHER): Payer: Managed Care, Other (non HMO) | Admitting: Family Medicine

## 2018-03-01 VITALS — BP 122/88 | HR 80 | Temp 98.3°F | Ht 62.0 in | Wt 191.0 lb

## 2018-03-01 DIAGNOSIS — F419 Anxiety disorder, unspecified: Secondary | ICD-10-CM | POA: Diagnosis not present

## 2018-03-01 DIAGNOSIS — E785 Hyperlipidemia, unspecified: Secondary | ICD-10-CM | POA: Diagnosis not present

## 2018-03-01 DIAGNOSIS — F900 Attention-deficit hyperactivity disorder, predominantly inattentive type: Secondary | ICD-10-CM | POA: Diagnosis not present

## 2018-03-01 DIAGNOSIS — I10 Essential (primary) hypertension: Secondary | ICD-10-CM | POA: Diagnosis not present

## 2018-03-01 DIAGNOSIS — Z0000 Encounter for general adult medical examination without abnormal findings: Secondary | ICD-10-CM | POA: Insufficient documentation

## 2018-03-01 DIAGNOSIS — F329 Major depressive disorder, single episode, unspecified: Secondary | ICD-10-CM | POA: Diagnosis not present

## 2018-03-01 DIAGNOSIS — F32A Depression, unspecified: Secondary | ICD-10-CM

## 2018-03-01 MED ORDER — BUPROPION HCL ER (XL) 300 MG PO TB24: 300.0000 mg | ORAL_TABLET | Freq: Every day | ORAL | 1 refills | Status: DC

## 2018-03-01 MED ORDER — ESCITALOPRAM OXALATE 20 MG PO TABS: ORAL_TABLET | ORAL | 1 refills | Status: DC

## 2018-03-01 MED ORDER — FLUTICASONE PROPIONATE 50 MCG/ACT NA SUSP: 2.0000 | Freq: Every day | NASAL | 99 refills | Status: DC

## 2018-03-01 MED ORDER — DEXMETHYLPHENIDATE HCL 10 MG PO TABS: 10.0000 mg | ORAL_TABLET | Freq: Two times a day (BID) | ORAL | 0 refills | Status: DC

## 2018-03-01 MED ORDER — LISINOPRIL-HYDROCHLOROTHIAZIDE 10-12.5 MG PO TABS: 1.0000 | ORAL_TABLET | Freq: Every day | ORAL | 1 refills | Status: DC

## 2018-03-01 NOTE — Assessment & Plan Note (Signed)
Improving. Wean off wellbutrin. Continue Lexapro. Call or return to clinic prn if these symptoms worsen or fail to improve as anticipated. The patient indicates understanding of these issues and agrees with the plan.

## 2018-03-01 NOTE — Assessment & Plan Note (Signed)
Labs today. No orders of the defined types were placed in this encounter.

## 2018-03-01 NOTE — Patient Instructions (Signed)
Great to see you. Wean off wellbutrin- one tablet every other day for one week and stop. Keep me updated.

## 2018-03-01 NOTE — Assessment & Plan Note (Signed)
Reviewed preventive care protocols, scheduled due services, and updated immunizations Discussed nutrition, exercise, diet, and healthy lifestyle.  

## 2018-03-01 NOTE — Assessment & Plan Note (Signed)
Continue Focalin

## 2018-03-01 NOTE — Progress Notes (Signed)
Subjective:   Patient ID: Teresa Villarreal, female    DOB: Sep 29, 1971, 47 y.o.   MRN: [id number]  Jakhia Buxton is a pleasant 47 y.o. year old female who presents to clinic today with Annual Exam (wants to CPE. no PAP.) and Medication Refill (would like to discuss medications)  on 03/01/2018  HPI:  Has been doing well.  UTD date on pap smear.  Due for mammogram.  Due for labs.  Doing well on focalin would like to try to wean off wellbutrin.  Health Maintenance  Topic Date Due  . INFLUENZA VACCINE  10/04/2018 (Originally 07/12/2017)  . MAMMOGRAM  12/08/2018  . PAP SMEAR  10/29/2019  . TETANUS/TDAP  10/10/2021  . HIV Screening  Addressed     Current Outpatient Medications on File Prior to Visit  Medication Sig Dispense Refill  . buPROPion (WELLBUTRIN XL) 300 MG 24 hr tablet Take 1 tablet (300 mg total) by mouth daily. MUST SCHEDULE ANNUAL PHYSICAL 90 tablet 0  . dexmethylphenidate (FOCALIN) 10 MG tablet Take 1 tablet (10 mg total) by mouth 2 (two) times daily. 60 tablet 0  . escitalopram (LEXAPRO) 20 MG tablet TAKE 1 TABLET (20 MG TOTAL) BY MOUTH DAILY. **MUST SCHEDULE ANNUAL PHYSICAL** 30 tablet 0  . fluticasone (FLONASE) 50 MCG/ACT nasal spray Place 2 sprays into both nostrils daily. 16 g 6  . ibuprofen (ADVIL,MOTRIN) 200 MG tablet Take 200 mg by mouth as needed.    [name] Kitchen lisinopril-hydrochlorothiazide (PRINZIDE,ZESTORETIC) 10-12.5 MG tablet TAKE 1 TABLET BY MOUTH EVERY DAY 90 tablet 0  . oseltamivir (TAMIFLU) 75 MG capsule Take 1 capsule (75 mg total) by mouth 2 (two) times daily. 10 capsule 0  . pseudoephedrine (SUDAFED) 30 MG tablet Take 30 mg by mouth as needed for congestion.     No current facility-administered medications on file prior to visit.     No Known Allergies  Past Medical History:  Diagnosis Date  . Chicken pox   . Depression   . Frequent headaches   . Genital warts   . History of kidney stones   . Hyperlipidemia     Past Surgical History:  Procedure  Laterality Date  . CYSTOSCOPY  2010  . HYSTEROSCOPY  2010  . KNEE ARTHROSCOPY Left 1990    Family History  Problem Relation Age of Onset  . Hyperlipidemia Mother   . Heart disease Mother   . Hypertension Mother   . Alzheimer's disease Mother   . COPD Mother   . Hyperlipidemia Father   . Hypertension Father   . Mitochondrial disorder Brother        Mitochondrial encephalomyopathy lactic acidosis  . Breast cancer Paternal Aunt   . Arthritis Maternal Grandmother     Social History   Socioeconomic History  . Marital status: Married    Spouse name: Not on file  . Number of children: Not on file  . Years of education: Not on file  . Highest education level: Not on file  Occupational History  . Not on file  Social Needs  . Financial resource strain: Not on file  . Food insecurity:    Worry: Not on file    Inability: Not on file  . Transportation needs:    Medical: Not on file    Non-medical: Not on file  Tobacco Use  . Smoking status: Never Smoker  . Smokeless tobacco: Never Used  Substance and Sexual Activity  . Alcohol use: Yes    Alcohol/week: 0.0 oz    Comment:  occasional  . Drug use: No  . Sexual activity: Yes  Lifestyle  . Physical activity:    Days per week: Not on file    Minutes per session: Not on file  . Stress: Not on file  Relationships  . Social connections:    Talks on phone: Not on file    Gets together: Not on file    Attends religious service: Not on file    Active member of club or organization: Not on file    Attends meetings of clubs or organizations: Not on file    Relationship status: Not on file  . Intimate partner violence:    Fear of current or ex partner: Not on file    Emotionally abused: Not on file    Physically abused: Not on file    Forced sexual activity: Not on file  Other Topics Concern  . Not on file  Social History Narrative  . Not on file   The PMH, PSH, Social History, Family History, Medications, and allergies  have been reviewed in [facility], and have been updated if relevant.   Review of Systems  Constitutional: Negative.   HENT: Negative.   Eyes: Negative.   Respiratory: Negative.   Cardiovascular: Negative.   Gastrointestinal: Negative.   Endocrine: Negative.   Genitourinary: Negative.   Musculoskeletal: Negative.   Skin: Negative.   Allergic/Immunologic: Negative.   Neurological: Negative.   Psychiatric/Behavioral: Negative.   All other systems reviewed and are negative.      Objective:    BP 122/88 (BP Location: Left Arm, Patient Position: Sitting, Cuff Size: Normal)   Pulse 80   Temp 98.3 F (36.8 C) (Oral)   Ht 5\' 2"  (1.575 m)   Wt 191 lb (86.6 kg)   SpO2 98%   BMI 34.93 kg/m   Wt Readings from Last 3 Encounters:  03/01/18 191 lb (86.6 kg)  02/01/18 184 lb 6.4 oz (83.6 kg)  10/09/17 190 lb 8 oz (86.4 kg)    Physical Exam  Nursing note and vitals reviewed.   General:  Well-developed,well-nourished,in no acute distress; alert,appropriate and cooperative throughout examination Head:  normocephalic and atraumatic.   Eyes:  vision grossly intact, PERRL Ears:  R ear normal and L ear normal externally, TMs clear bilaterally Nose:  no external deformity.   Mouth:  good dentition.   Neck:  No deformities, masses, or tenderness noted. Breasts:  No mass, nodules, thickening, tenderness, bulging, retraction, inflamation, nipple discharge or skin changes noted.   Lungs:  Normal respiratory effort, chest expands symmetrically. Lungs are clear to auscultation, no crackles or wheezes. Heart:  Normal rate and regular rhythm. S1 and S2 normal without gallop, murmur, click, rub or other extra sounds. Abdomen:  Bowel sounds positive,abdomen soft and non-tender without masses, organomegaly or hernias noted. Rectal:  no external abnormalities.   Genitalia:  Pelvic Exam:        External: normal female genitalia without lesions or masses        Vagina: normal without lesions or masses         Cervix: normal without lesions or masses        Adnexa: normal bimanual exam without masses or fullness        Uterus: normal by palpation        Pap smear: performed Msk:  No deformity or scoliosis noted of thoracic or lumbar spine.   Extremities:  No clubbing, cyanosis, edema, or deformity noted with normal full range of motion of all  joints.   Neurologic:  alert & oriented X3 and gait normal.   Skin:  Intact without suspicious lesions or rashes Cervical Nodes:  No lymphadenopathy noted Axillary Nodes:  No palpable lymphadenopathy Psych:  Cognition and judgment appear intact. Alert and cooperative with normal attention span and concentration. No apparent delusions, illusions, hallucinations        Assessment & Plan:   No diagnosis found. No follow-ups on file.

## 2018-03-02 ENCOUNTER — Other Ambulatory Visit: Payer: Self-pay | Admitting: Family Medicine

## 2018-03-03 LAB — CBC WITH DIFFERENTIAL/PLATELET
BASOS: 0 %
Basophils Absolute: 0 10*3/uL (ref 0.0–0.2)
EOS (ABSOLUTE): 0.2 10*3/uL (ref 0.0–0.4)
EOS: 3 %
HEMATOCRIT: 34.3 % (ref 34.0–46.6)
Hemoglobin: 11.8 g/dL (ref 11.1–15.9)
IMMATURE GRANS (ABS): 0 10*3/uL (ref 0.0–0.1)
IMMATURE GRANULOCYTES: 0 %
LYMPHS: 27 %
Lymphocytes Absolute: 1.5 10*3/uL (ref 0.7–3.1)
MCH: 31.5 pg (ref 26.6–33.0)
MCHC: 34.4 g/dL (ref 31.5–35.7)
MCV: 92 fL (ref 79–97)
Monocytes Absolute: 0.4 10*3/uL (ref 0.1–0.9)
Monocytes: 7 %
NEUTROS PCT: 63 %
Neutrophils Absolute: 3.4 10*3/uL (ref 1.4–7.0)
PLATELETS: 265 10*3/uL (ref 150–379)
RBC: 3.75 x10E6/uL — ABNORMAL LOW (ref 3.77–5.28)
RDW: 13.8 % (ref 12.3–15.4)
WBC: 5.5 10*3/uL (ref 3.4–10.8)

## 2018-03-03 LAB — COMPREHENSIVE METABOLIC PANEL
ALT: 15 IU/L (ref 0–32)
AST: 13 IU/L (ref 0–40)
Albumin/Globulin Ratio: 1.7 (ref 1.2–2.2)
Albumin: 4.1 g/dL (ref 3.5–5.5)
Alkaline Phosphatase: 89 IU/L (ref 39–117)
BUN/Creatinine Ratio: 19 (ref 9–23)
BUN: 17 mg/dL (ref 6–24)
Bilirubin Total: 0.3 mg/dL (ref 0.0–1.2)
CALCIUM: 8.8 mg/dL (ref 8.7–10.2)
CO2: 22 mmol/L (ref 20–29)
CREATININE: 0.88 mg/dL (ref 0.57–1.00)
Chloride: 104 mmol/L (ref 96–106)
GFR, EST AFRICAN AMERICAN: 90 mL/min/{1.73_m2} (ref >59)
GFR, EST NON AFRICAN AMERICAN: 78 mL/min/{1.73_m2} (ref >59)
GLOBULIN, TOTAL: 2.4 g/dL (ref 1.5–4.5)
Glucose: 86 mg/dL (ref 65–99)
POTASSIUM: 4.1 mmol/L (ref 3.5–5.2)
Sodium: 142 mmol/L (ref 134–144)
TOTAL PROTEIN: 6.5 g/dL (ref 6.0–8.5)

## 2018-03-03 LAB — SPECIMEN STATUS REPORT

## 2018-03-03 LAB — LIPID PANEL W/O CHOL/HDL RATIO
Cholesterol, Total: 245 mg/dL — ABNORMAL HIGH (ref 100–199)
HDL: 61 mg/dL (ref >39)
LDL CALC: 156 mg/dL — AB (ref 0–99)
Triglycerides: 140 mg/dL (ref 0–149)
VLDL Cholesterol Cal: 28 mg/dL (ref 5–40)

## 2018-03-03 LAB — TSH: TSH: 1.63 u[IU]/mL (ref 0.450–4.500)

## 2018-05-17 ENCOUNTER — Other Ambulatory Visit: Payer: Self-pay | Admitting: Family Medicine

## 2018-05-17 MED ORDER — AZITHROMYCIN 250 MG PO TABS: ORAL_TABLET | ORAL | 0 refills | Status: DC

## 2018-06-12 ENCOUNTER — Other Ambulatory Visit: Payer: Self-pay | Admitting: Family Medicine

## 2018-06-12 MED ORDER — DEXMETHYLPHENIDATE HCL 10 MG PO TABS: 10.0000 mg | ORAL_TABLET | Freq: Two times a day (BID) | ORAL | 0 refills | Status: DC

## 2018-07-04 ENCOUNTER — Encounter: Payer: Self-pay | Admitting: Family Medicine

## 2018-07-16 ENCOUNTER — Other Ambulatory Visit: Payer: Self-pay

## 2018-07-16 ENCOUNTER — Other Ambulatory Visit: Payer: Self-pay | Admitting: Family Medicine

## 2018-07-16 MED ORDER — DEXMETHYLPHENIDATE HCL 10 MG PO TABS: 10.0000 mg | ORAL_TABLET | Freq: Two times a day (BID) | ORAL | 0 refills | Status: DC

## 2018-07-16 NOTE — Progress Notes (Signed)
Printed Focalin for Aug, Sept, Oct with note for pt to sched appt for Oct/pt aware Rx's are ready at the front and agrees to shed/thx dmf

## 2018-08-06 ENCOUNTER — Encounter: Payer: Self-pay | Admitting: Family Medicine

## 2018-08-08 ENCOUNTER — Ambulatory Visit (INDEPENDENT_AMBULATORY_CARE_PROVIDER_SITE_OTHER): Payer: Managed Care, Other (non HMO) | Admitting: Family Medicine

## 2018-08-08 ENCOUNTER — Encounter: Payer: Self-pay | Admitting: Family Medicine

## 2018-08-08 VITALS — BP 156/90 | HR 96 | Temp 98.6°F | Ht 62.0 in | Wt 187.6 lb

## 2018-08-08 DIAGNOSIS — R0602 Shortness of breath: Secondary | ICD-10-CM

## 2018-08-08 DIAGNOSIS — F329 Major depressive disorder, single episode, unspecified: Secondary | ICD-10-CM

## 2018-08-08 DIAGNOSIS — I10 Essential (primary) hypertension: Secondary | ICD-10-CM | POA: Diagnosis not present

## 2018-08-08 DIAGNOSIS — F419 Anxiety disorder, unspecified: Secondary | ICD-10-CM | POA: Diagnosis not present

## 2018-08-08 DIAGNOSIS — R51 Headache: Secondary | ICD-10-CM

## 2018-08-08 DIAGNOSIS — F32A Depression, unspecified: Secondary | ICD-10-CM

## 2018-08-08 DIAGNOSIS — R519 Headache, unspecified: Secondary | ICD-10-CM

## 2018-08-08 MED ORDER — LISINOPRIL 10 MG PO TABS: 10.0000 mg | ORAL_TABLET | Freq: Every day | ORAL | 3 refills | Status: DC

## 2018-08-08 MED ORDER — BUPROPION HCL ER (XL) 300 MG PO TB24: 300.0000 mg | ORAL_TABLET | Freq: Every day | ORAL | 1 refills | Status: DC

## 2018-08-08 NOTE — Assessment & Plan Note (Signed)
Deteriorated, likely tension/HTN. Treat HTN and increased anxiety as documented under those problems.

## 2018-08-08 NOTE — Assessment & Plan Note (Signed)
Deteriorated- PHQ 9 is 18. She is currently taking Lexapro 20 mg daily only.  In past she was taking wellbutrin with this and it worked well.  Restart Wellbutrin 150 mg XL daily and continue lexapro at current dose. She will update me in several weeks. Has seen Dr. Rexene Edison in past.  She will consider reaching out to her as well.

## 2018-08-08 NOTE — Assessment & Plan Note (Signed)
Deteriorated likely due to increased stressors. Restart lisinopril (not hctz yet) 10 mg daily. She will update me with readings over the next two weeks.

## 2018-08-08 NOTE — Progress Notes (Signed)
Subjective:   Patient ID: Teresa Villarreal, female    DOB: 1971/11/07, 47 y.o.   MRN: [id number]  Teresa Villarreal is a pleasant 47 y.o. year old female who presents to clinic today with Hypertension (Patient is here today C/O her BP being as high as 167/94 and not feeling like herself.  She had not been monitoring her BP but has had headaches with neck pain x3wks and then dizziness started 1-week-ago.  Denies any CP, arm pain, SOB, or pressure.  The Lisinopril-HCT was D/C'ed back in March as BP was well controlled.  She states that she is real emotional right now and under a lot of stress.  Her dad was just moved to memory care and mom may be getting pneumonia.  Unable to breathe deep.)  on 08/08/2018  HPI:  HTN- BP has been elevated, as high as 167/94 for past 3 weeks.  Has been having headaches with neck pain as well.  Dizziness started a week ago.  Denies any CP.  She is having a hard time catching her breath lately.  She was previously taking lisinopril-HCTZ but this was d/c'd in March 2019 and has been diet controlled until recently.  She is under more stress- both of her parents are in failing health. She is taking lexapro 20 mg daily.  Current Outpatient Medications on File Prior to Visit  Medication Sig Dispense Refill  . dexmethylphenidate (FOCALIN) 10 MG tablet Take 1 tablet (10 mg total) by mouth 2 (two) times daily. 60 tablet 0  . escitalopram (LEXAPRO) 20 MG tablet TAKE 1 TABLET (20 MG TOTAL) BY MOUTH DAILY. 90 tablet 1  . fluticasone (FLONASE) 50 MCG/ACT nasal spray Place 2 sprays into both nostrils daily. 16 g PRN  . ibuprofen (ADVIL,MOTRIN) 200 MG tablet Take 200 mg by mouth as needed.    . pseudoephedrine (SUDAFED) 30 MG tablet Take 30 mg by mouth as needed for congestion.     No current facility-administered medications on file prior to visit.     No Known Allergies  Past Medical History:  Diagnosis Date  . Chicken pox   . Depression   . Frequent headaches   . Genital  warts   . History of kidney stones   . Hyperlipidemia     Past Surgical History:  Procedure Laterality Date  . CYSTOSCOPY  2010  . HYSTEROSCOPY  2010  . KNEE ARTHROSCOPY Left 1990    Family History  Problem Relation Age of Onset  . Hyperlipidemia Mother   . Heart disease Mother   . Hypertension Mother   . Alzheimer's disease Mother   . COPD Mother   . Hyperlipidemia Father   . Hypertension Father   . Mitochondrial disorder Brother        Mitochondrial encephalomyopathy lactic acidosis  . Breast cancer Paternal Aunt   . Arthritis Maternal Grandmother     Social History   Socioeconomic History  . Marital status: Married    Spouse name: Not on file  . Number of children: Not on file  . Years of education: Not on file  . Highest education level: Not on file  Occupational History  . Not on file  Social Needs  . Financial resource strain: Not on file  . Food insecurity:    Worry: Not on file    Inability: Not on file  . Transportation needs:    Medical: Not on file    Non-medical: Not on file  Tobacco Use  . Smoking status: Never  Smoker  . Smokeless tobacco: Never Used  Substance and Sexual Activity  . Alcohol use: Yes    Alcohol/week: 0.0 standard drinks    Comment: occasional  . Drug use: No  . Sexual activity: Yes  Lifestyle  . Physical activity:    Days per week: Not on file    Minutes per session: Not on file  . Stress: Not on file  Relationships  . Social connections:    Talks on phone: Not on file    Gets together: Not on file    Attends religious service: Not on file    Active member of club or organization: Not on file    Attends meetings of clubs or organizations: Not on file    Relationship status: Not on file  . Intimate partner violence:    Fear of current or ex partner: Not on file    Emotionally abused: Not on file    Physically abused: Not on file    Forced sexual activity: Not on file  Other Topics Concern  . Not on file  Social  History Narrative  . Not on file   The PMH, PSH, Social History, Family History, Medications, and allergies have been reviewed in [facility], and have been updated if relevant.   Review of Systems  Constitutional: Negative.   HENT: Negative.   Eyes: Negative.   Respiratory: Positive for shortness of breath. Negative for apnea, cough, choking, chest tightness, wheezing and stridor.   Cardiovascular: Negative.   Gastrointestinal: Negative.   Endocrine: Negative.   Musculoskeletal: Positive for neck pain.  Skin: Negative.   Allergic/Immunologic: Negative.   Neurological: Positive for dizziness, light-headedness and headaches. Negative for tremors, seizures, syncope, facial asymmetry, speech difficulty, weakness and numbness.  Psychiatric/Behavioral: Positive for decreased concentration. Negative for agitation, behavioral problems, confusion, dysphoric mood, hallucinations, self-injury, sleep disturbance and suicidal ideas. The patient is nervous/anxious. The patient is not hyperactive.   All other systems reviewed and are negative.      Objective:    BP (!) 156/90 (BP Location: Left Arm, Cuff Size: Normal)   Pulse 96   Temp 98.6 F (37 C) (Oral)   Ht 5\' 2"  (1.575 m)   Wt 187 lb 9.6 oz (85.1 kg)   LMP 07/15/2018   SpO2 98%   BMI 34.31 kg/m    Physical Exam  Constitutional: She is oriented to person, place, and time. She appears well-developed and well-nourished. No distress.  HENT:  Head: Normocephalic and atraumatic.  Eyes: EOM are normal.  Neck: Normal range of motion.  Cardiovascular: Normal rate and regular rhythm.  Pulmonary/Chest: Effort normal and breath sounds normal.  Musculoskeletal: Normal range of motion. She exhibits no edema.  Neurological: She is alert and oriented to person, place, and time. No cranial nerve deficit.  Skin: Skin is warm and dry. She is not diaphoretic.  Psychiatric: She has a normal mood and affect. Her behavior is normal. Judgment and thought  content normal.  Tearful but appropriate  Nursing note and vitals reviewed.         Assessment & Plan:   SOB (shortness of breath) - Plan: EKG 12-Lead  Essential hypertension - Plan: EKG 12-Lead  Shortness of breath  Anxiety and depression No follow-ups on file.

## 2018-08-08 NOTE — Assessment & Plan Note (Signed)
EKG reassuring- NSR. Likely due to increased anxiety and depression. See below.

## 2018-08-08 NOTE — Patient Instructions (Signed)
Great to see you.   We are restarting Wellbutrin and lisinopril.  Please keep me updated.

## 2018-09-01 ENCOUNTER — Other Ambulatory Visit: Payer: Self-pay | Admitting: Family Medicine

## 2018-09-11 ENCOUNTER — Encounter: Payer: Self-pay | Admitting: Family Medicine

## 2018-09-11 ENCOUNTER — Other Ambulatory Visit: Payer: Self-pay | Admitting: Family Medicine

## 2018-09-11 ENCOUNTER — Ambulatory Visit (INDEPENDENT_AMBULATORY_CARE_PROVIDER_SITE_OTHER): Payer: Managed Care, Other (non HMO) | Admitting: Family Medicine

## 2018-09-11 VITALS — BP 126/80 | HR 93 | Temp 100.0°F | Ht 62.0 in | Wt 186.6 lb

## 2018-09-11 DIAGNOSIS — I10 Essential (primary) hypertension: Secondary | ICD-10-CM | POA: Diagnosis not present

## 2018-09-11 DIAGNOSIS — F419 Anxiety disorder, unspecified: Secondary | ICD-10-CM

## 2018-09-11 DIAGNOSIS — F32A Depression, unspecified: Secondary | ICD-10-CM

## 2018-09-11 DIAGNOSIS — F329 Major depressive disorder, single episode, unspecified: Secondary | ICD-10-CM

## 2018-09-11 MED ORDER — DEXMETHYLPHENIDATE HCL 10 MG PO TABS: 10.0000 mg | ORAL_TABLET | Freq: Two times a day (BID) | ORAL | 0 refills | Status: DC

## 2018-09-11 MED ORDER — LISINOPRIL 10 MG PO TABS: 10.0000 mg | ORAL_TABLET | Freq: Every day | ORAL | 3 refills | Status: DC

## 2018-09-11 MED ORDER — BUPROPION HCL ER (XL) 300 MG PO TB24: 300.0000 mg | ORAL_TABLET | Freq: Every day | ORAL | 1 refills | Status: DC

## 2018-09-11 NOTE — Patient Instructions (Signed)
Great to see you! Happy birthday! 

## 2018-09-11 NOTE — Assessment & Plan Note (Signed)
Improved with lisinopril 10 mg daily. No changes made to rxs.

## 2018-09-11 NOTE — Assessment & Plan Note (Signed)
Improved.  PHQ9 improved from 18 at last OV to 4. Continue Welbutrin XL 300 mg daily. She does not feel she needs psychotherapy at this time.

## 2018-09-11 NOTE — Progress Notes (Signed)
Subjective:   Patient ID: Teresa Villarreal, female    DOB: Aug 04, 1971, 47 y.o.   MRN: [id number]  Teresa Villarreal is a pleasant 47 y.o. year old female who presents to clinic today with Follow-up (Patient is here today for a 62-month-F/U.  On 8.28.19 pt had issues with BP and Lisinopril 10mg  1qd was restarted.  Also had issues with depression PSQ score was 18 and Wellburin XL 150mg  1qd was restarted as well.  She states that she feels much better than she did at last visit.  She treated herself to 2 massages which has helped as well. )  on 09/11/2018  HPI:  Here for 2 month follow up.  I last saw her on 08/09/18- note reviewed.  HTN- at that [place], we restarted Lisinopril 10 mg daily as her blood pressure had been slowly increased and she was becoming symptomatic with headaches, neck pain and dizziness.  She had previously been taking lisinpril- hctz but it was d/c'd in March.  She had been diet controlled until recently.  Depression- PHQ was 18 at that OV.  She has been under more stress with her parents failing health.  Had been well controlled on lexapro 20 mg daily.  We therefore started her back on Wellubtrin 300 mg XL daily and advised to continue lexapro 20 mg daily. Has seen Dr. Rexene Edison in past.  I advised her to reach ou to her as well.  Today, she states that she feels much better.  She has also had 2 massages which she feels has helped. Neck pain and headaches improved. Dizziness has improved.  Also doing meditation.  Current Outpatient Medications on File Prior to Visit  Medication Sig Dispense Refill  . dexmethylphenidate (FOCALIN) 10 MG tablet Take 1 tablet (10 mg total) by mouth 2 (two) times daily. 60 tablet 0  . escitalopram (LEXAPRO) 20 MG tablet TAKE 1 TABLET BY MOUTH EVERY DAY 90 tablet 1  . fluticasone (FLONASE) 50 MCG/ACT nasal spray Place 2 sprays into both nostrils daily. 16 g PRN  . ibuprofen (ADVIL,MOTRIN) 200 MG tablet Take 200 mg by mouth as needed.    [name] Kitchen lisinopril  (PRINIVIL,ZESTRIL) 10 MG tablet Take 1 tablet (10 mg total) by mouth daily. 90 tablet 3  . pseudoephedrine (SUDAFED) 30 MG tablet Take 30 mg by mouth as needed for congestion.     No current facility-administered medications on file prior to visit.     No Known Allergies  Past Medical History:  Diagnosis Date  . Chicken pox   . Depression   . Frequent headaches   . Genital warts   . History of kidney stones   . Hyperlipidemia     Past Surgical History:  Procedure Laterality Date  . CYSTOSCOPY  2010  . HYSTEROSCOPY  2010  . KNEE ARTHROSCOPY Left 1990    Family History  Problem Relation Age of Onset  . Hyperlipidemia Mother   . Heart disease Mother   . Hypertension Mother   . Alzheimer's disease Mother   . COPD Mother   . Hyperlipidemia Father   . Hypertension Father   . Mitochondrial disorder Brother        Mitochondrial encephalomyopathy lactic acidosis  . Breast cancer Paternal Aunt   . Arthritis Maternal Grandmother     Social History   Socioeconomic History  . Marital status: Married    Spouse name: Not on file  . Number of children: Not on file  . Years of education: Not on file  .  Highest education level: Not on file  Occupational History  . Not on file  Social Needs  . Financial resource strain: Not on file  . Food insecurity:    Worry: Not on file    Inability: Not on file  . Transportation needs:    Medical: Not on file    Non-medical: Not on file  Tobacco Use  . Smoking status: Never Smoker  . Smokeless tobacco: Never Used  Substance and Sexual Activity  . Alcohol use: Yes    Alcohol/week: 0.0 standard drinks    Comment: occasional  . Drug use: No  . Sexual activity: Yes  Lifestyle  . Physical activity:    Days per week: Not on file    Minutes per session: Not on file  . Stress: Not on file  Relationships  . Social connections:    Talks on phone: Not on file    Gets together: Not on file    Attends religious service: Not on file     Active member of club or organization: Not on file    Attends meetings of clubs or organizations: Not on file    Relationship status: Not on file  . Intimate partner violence:    Fear of current or ex partner: Not on file    Emotionally abused: Not on file    Physically abused: Not on file    Forced sexual activity: Not on file  Other Topics Concern  . Not on file  Social History Narrative  . Not on file   The PMH, PSH, Social History, Family History, Medications, and allergies have been reviewed in [facility], and have been updated if relevant.  Review of Systems  Constitutional: Negative.   HENT: Negative.   Respiratory: Negative.   Cardiovascular: Negative.   Gastrointestinal: Negative.   Endocrine: Negative.   Genitourinary: Negative.   Musculoskeletal: Negative.   Allergic/Immunologic: Negative.   Neurological: Negative.   Hematological: Negative.   Psychiatric/Behavioral: Negative.   All other systems reviewed and are negative.      Objective:    BP 126/80 (BP Location: Left Arm, Patient Position: Sitting, Cuff Size: Normal)   Pulse 93   Temp 100 F (37.8 C) (Oral)   Ht 5\' 2"  (1.575 m)   Wt 186 lb 9.6 oz (84.6 kg)   LMP 08/21/2018   SpO2 97%   BMI 34.13 kg/m    Physical Exam    General:  Well-developed,well-nourished,in no acute distress; alert,appropriate and cooperative throughout examination Head:  normocephalic and atraumatic.   Eyes:  vision grossly intact, PERRL Ears:  R ear normal and L ear normal externally, TMs clear bilaterally Nose:  no external deformity.   Mouth:  good dentition.   Neck:  No deformities, masses, or tenderness noted. Lungs:  Normal respiratory effort, chest expands symmetrically. Lungs are clear to auscultation, no crackles or wheezes. Heart:  Normal rate and regular rhythm. S1 and S2 normal without gallop, murmur, click, rub or other extra sounds. Msk:  No deformity or scoliosis noted of thoracic or lumbar spine.   Extremities:   No clubbing, cyanosis, edema, or deformity noted with normal full range of motion of all joints.   Neurologic:  alert & oriented X3 and gait normal.   Skin:  Intact without suspicious lesions or rashes Cervical Nodes:  No lymphadenopathy noted Axillary Nodes:  No palpable lymphadenopathy Psych:  Cognition and judgment appear intact. Alert and cooperative with normal attention span and concentration. No apparent delusions, illusions, hallucinations  Assessment & Plan:   Anxiety and depression  Essential hypertension No follow-ups on file.

## 2018-09-27 ENCOUNTER — Ambulatory Visit: Payer: Self-pay

## 2018-09-27 MED ORDER — NYSTATIN 100000 UNIT/ML MT SUSP: 5.0000 mL | Freq: Four times a day (QID) | OROMUCOSAL | 0 refills | Status: DC

## 2018-09-27 NOTE — Telephone Encounter (Signed)
eRx sent for Nystatin rinse but I do want her to be seen if symptoms persist.

## 2018-09-27 NOTE — Telephone Encounter (Signed)
Call returned to patient who called to report thrush like area in her mouth. She states that she has been taking Prevacid for reflux she has recently been experiencing and notices a white area on the back of her tongue that looked as though a pill has dissolved. Pt stated that she tried to brush it away with her tooth brush but it just bleed. Pt says today that area is gone but the tip of her tongue is really sore. She feel her whole tongue is red. Pt denies swelling, weekend immune system recent antibiotic use. She states that its very painful to drink. Pt rates pain as 5 on the scale. Pt refuses appointment and instead is requesting to speak with Dr Deborra Medina. Flow Coordinator Nicholson notified. Note will be routed to practice. Care advice read to patient. Pt verbalized understanding of all instructions.  Reason for Disposition . White patches that stick to tongue or inner cheek  Answer Assessment - Initial Assessment Questions 1. SYMPTOM: "What's the main symptom you're concerned about?" (e.g., dry mouth. chapped lips, lump)     White patch back of tongue 2. ONSET: "When did the  thrush  start?"     Last week 3. PAIN: "Is there any pain?" If so, ask: "How bad is it?" (Scale: 1-10; mild, moderate, severe)     5 4. CAUSE: "What do you think is causing the symptoms?"     thrush 5. OTHER SYMPTOMS: "Do you have any other symptoms?" (e.g., fever, sore throat, toothache, swelling)     no 6. PREGNANCY: "Is there any chance you are pregnant?" "When was your last menstrual period?"     Last week september  Protocols used: MOUTH [facility]

## 2018-09-27 NOTE — Addendum Note (Signed)
Addended by: Lucille Passy on: 09/27/2018 05:28 PM   Modules accepted: Orders

## 2018-09-27 NOTE — Telephone Encounter (Signed)
TA-Pt reports that she is experiencing thrush on her tongue/plz see triage message below/thx dmf

## 2018-09-28 NOTE — Telephone Encounter (Signed)
LMOVM stating that Rx was sent and to be seen if Sx persist/thx dmf

## 2018-10-19 ENCOUNTER — Encounter: Payer: Self-pay | Admitting: Family Medicine

## 2018-10-22 ENCOUNTER — Ambulatory Visit (INDEPENDENT_AMBULATORY_CARE_PROVIDER_SITE_OTHER): Payer: Managed Care, Other (non HMO) | Admitting: Family Medicine

## 2018-10-22 ENCOUNTER — Encounter: Payer: Self-pay | Admitting: Family Medicine

## 2018-10-22 VITALS — BP 118/66 | HR 97 | Temp 98.3°F | Ht 62.0 in | Wt 184.0 lb

## 2018-10-22 DIAGNOSIS — R5383 Other fatigue: Secondary | ICD-10-CM | POA: Diagnosis not present

## 2018-10-22 DIAGNOSIS — R1013 Epigastric pain: Secondary | ICD-10-CM | POA: Diagnosis not present

## 2018-10-22 DIAGNOSIS — K219 Gastro-esophageal reflux disease without esophagitis: Secondary | ICD-10-CM | POA: Diagnosis not present

## 2018-10-22 NOTE — Progress Notes (Signed)
Subjective:   Patient ID: Teresa Villarreal, female    DOB: 1971-06-28, 48 y.o.   MRN: [id number]  Teresa Villarreal is a pleasant 47 y.o. year old female who presents to clinic today with Follow-up (Reflux has not improved and bowel difficulties have not improved-has been doing an [place] once a month. Constipation has not improved. Admits to bloating. Denies abdominal pain.She still cannot drink coffee-feels like something is stuck in her throat.  Has been taking Probiotics.)  on 10/22/2018  HPI:  Thrush- improved with nystatin rinse but her tongue is still very sensitive.  Having several GI symptoms- reflux, constipation, bloating.  She feels like something is stuck in her throat when she drinks coffee. She also is wondering if maybe she has a stomach ulcer.  She has been taking NSAIDs recently.  She started taking Pepto for reflux symptoms. She then tried pepcid which didn't help.  Prevacid does seem to help a little.  Also very tired and feels bloated.   No black stools, did get black tongue with pepto bismol. Intermittent epigastric pain, no blood in stool.  She has also been taking probiotics as she has a history of constipation and bloating.    Current Outpatient Medications on File Prior to Visit  Medication Sig Dispense Refill  . aspirin-acetaminophen-caffeine (EXCEDRIN MIGRAINE) 250-250-65 MG tablet Take by mouth every 6 (six) hours as needed for headache.    [name] Kitchen buPROPion (WELLBUTRIN XL) 300 MG 24 hr tablet Take 1 tablet (300 mg total) by mouth daily. 90 tablet 1  . dexmethylphenidate (FOCALIN) 10 MG tablet Take 1 tablet (10 mg total) by mouth 2 (two) times daily. 60 tablet 0  . escitalopram (LEXAPRO) 20 MG tablet TAKE 1 TABLET BY MOUTH EVERY DAY 90 tablet 1  . famotidine (PEPCID) 10 MG tablet Take 10 mg by mouth 2 (two) times daily.    . fluticasone (FLONASE) 50 MCG/ACT nasal spray Place 2 sprays into both nostrils daily. 16 g PRN  . ibuprofen (ADVIL,MOTRIN) 200 MG tablet Take 200 mg by  mouth as needed.    . lansoprazole (PREVACID) 15 MG capsule Take 15 mg by mouth daily at 12 noon.    [name] Kitchen lisinopril (PRINIVIL,ZESTRIL) 10 MG tablet Take 1 tablet (10 mg total) by mouth daily. 90 tablet 3  . nystatin (MYCOSTATIN) 100000 UNIT/ML suspension Use as directed 5 mLs (500,000 Units total) in the mouth or throat 4 (four) times daily. 473 mL 0  . pseudoephedrine (SUDAFED) 30 MG tablet Take 30 mg by mouth as needed for congestion.     No current facility-administered medications on file prior to visit.     No Known Allergies  Past Medical History:  Diagnosis Date  . Chicken pox   . Depression   . Frequent headaches   . Genital warts   . History of kidney stones   . Hyperlipidemia     Past Surgical History:  Procedure Laterality Date  . CYSTOSCOPY  2010  . HYSTEROSCOPY  2010  . KNEE ARTHROSCOPY Left 1990    Family History  Problem Relation Age of Onset  . Hyperlipidemia Mother   . Heart disease Mother   . Hypertension Mother   . Alzheimer's disease Mother   . COPD Mother   . Hyperlipidemia Father   . Hypertension Father   . Mitochondrial disorder Brother        Mitochondrial encephalomyopathy lactic acidosis  . Breast cancer Paternal Aunt   . Arthritis Maternal Grandmother     Social History  Socioeconomic History  . Marital status: Married    Spouse name: Not on file  . Number of children: Not on file  . Years of education: Not on file  . Highest education level: Not on file  Occupational History  . Not on file  Social Needs  . Financial resource strain: Not on file  . Food insecurity:    Worry: Not on file    Inability: Not on file  . Transportation needs:    Medical: Not on file    Non-medical: Not on file  Tobacco Use  . Smoking status: Never Smoker  . Smokeless tobacco: Never Used  Substance and Sexual Activity  . Alcohol use: Yes    Alcohol/week: 0.0 standard drinks    Comment: occasional  . Drug use: No  . Sexual activity: Yes    Lifestyle  . Physical activity:    Days per week: Not on file    Minutes per session: Not on file  . Stress: Not on file  Relationships  . Social connections:    Talks on phone: Not on file    Gets together: Not on file    Attends religious service: Not on file    Active member of club or organization: Not on file    Attends meetings of clubs or organizations: Not on file    Relationship status: Not on file  . Intimate partner violence:    Fear of current or ex partner: Not on file    Emotionally abused: Not on file    Physically abused: Not on file    Forced sexual activity: Not on file  Other Topics Concern  . Not on file  Social History Narrative  . Not on file   The PMH, PSH, Social History, Family History, Medications, and allergies have been reviewed in [facility], and have been updated if relevant.   Review of Systems  Constitutional: Positive for fatigue.  Eyes: Negative.   Respiratory: Negative.   Cardiovascular: Negative.   Gastrointestinal: Positive for abdominal distention, abdominal pain and constipation. Negative for anal bleeding, blood in stool, diarrhea, nausea, rectal pain and vomiting.  Endocrine: Negative.   Genitourinary: Negative.   Musculoskeletal: Negative.   Allergic/Immunologic: Negative.   Neurological: Negative.   Hematological: Negative.   Psychiatric/Behavioral: Negative.   All other systems reviewed and are negative.      Objective:    BP 118/66   Pulse 97   Temp 98.3 F (36.8 C) (Oral)   Ht 5\' 2"  (1.575 m)   Wt 184 lb (83.5 kg)   SpO2 98%   BMI 33.65 kg/m    Physical Exam  Constitutional: She is oriented to person, place, and time. She appears well-developed and well-nourished. No distress.  HENT:  Head: Normocephalic and atraumatic.  Eyes: EOM are normal.  Neck: Normal range of motion.  Cardiovascular: Normal rate.  Pulmonary/Chest: Effort normal.  Abdominal: Soft. She exhibits no distension and no mass. There is tenderness.  There is no rebound and no guarding.  Musculoskeletal: Normal range of motion. She exhibits no edema.  Neurological: She is alert and oriented to person, place, and time. No cranial nerve deficit.  Skin: Skin is warm and dry. She is not diaphoretic.  Psychiatric: She has a normal mood and affect. Her behavior is normal. Judgment and thought content normal.  Nursing note and vitals reviewed.         Assessment & Plan:   Epigastric pain - Plan: Lipase, CANCELED: Lipase  Gastroesophageal reflux  disease, esophagitis presence not specified - Plan: H. pylori antibody, IgG, Ambulatory referral to Gastroenterology  Other fatigue - Plan: Comprehensive metabolic panel, CBC with Differential/Platelet, TSH No follow-ups on file.

## 2018-10-22 NOTE — Assessment & Plan Note (Signed)
Persistent with fatigue and abdominal bloating.  Continue PPI and refer to GI for endoscopy given new symptoms of dysphagia with liquids. I also suspect possible H pylori infection based on symptoms- check labs today. The patient indicates understanding of these issues and agrees with the plan. Orders Placed This Encounter  Procedures  . H. pylori antibody, IgG  . Comprehensive metabolic panel  . CBC with Differential/Platelet  . TSH  . Lipase  . Ambulatory referral to Gastroenterology

## 2018-10-22 NOTE — Patient Instructions (Signed)
Great to see you. I will call you with your lab results from today and you can view them online.   We are referring you to GI as well. Someone will call you with an appointment.

## 2018-10-24 ENCOUNTER — Encounter: Payer: Self-pay | Admitting: Family Medicine

## 2018-10-24 LAB — TSH: TSH: 0.855 u[IU]/mL (ref 0.450–4.500)

## 2018-10-24 LAB — COMPREHENSIVE METABOLIC PANEL
ALK PHOS: 89 IU/L (ref 39–117)
ALT: 13 IU/L (ref 0–32)
AST: 17 IU/L (ref 0–40)
Albumin/Globulin Ratio: 2 (ref 1.2–2.2)
Albumin: 4.7 g/dL (ref 3.5–5.5)
BILIRUBIN TOTAL: 0.4 mg/dL (ref 0.0–1.2)
BUN/Creatinine Ratio: 22 (ref 9–23)
BUN: 19 mg/dL (ref 6–24)
CO2: 26 mmol/L (ref 20–29)
CREATININE: 0.88 mg/dL (ref 0.57–1.00)
Calcium: 9.6 mg/dL (ref 8.7–10.2)
Chloride: 101 mmol/L (ref 96–106)
GFR calc Af Amer: 90 mL/min/{1.73_m2} (ref >59)
GFR calc non Af Amer: 78 mL/min/{1.73_m2} (ref >59)
GLUCOSE: 79 mg/dL (ref 65–99)
Globulin, Total: 2.3 g/dL (ref 1.5–4.5)
Potassium: 4.6 mmol/L (ref 3.5–5.2)
Sodium: 141 mmol/L (ref 134–144)
Total Protein: 7 g/dL (ref 6.0–8.5)

## 2018-10-24 LAB — CBC WITH DIFFERENTIAL/PLATELET
BASOS: 1 %
Basophils Absolute: 0 10*3/uL (ref 0.0–0.2)
EOS (ABSOLUTE): 0.1 10*3/uL (ref 0.0–0.4)
EOS: 2 %
HEMATOCRIT: 37.6 % (ref 34.0–46.6)
Hemoglobin: 12.4 g/dL (ref 11.1–15.9)
IMMATURE GRANULOCYTES: 0 %
Immature Grans (Abs): 0 10*3/uL (ref 0.0–0.1)
Lymphocytes Absolute: 1.6 10*3/uL (ref 0.7–3.1)
Lymphs: 29 %
MCH: 30.9 pg (ref 26.6–33.0)
MCHC: 33 g/dL (ref 31.5–35.7)
MCV: 94 fL (ref 79–97)
MONOS ABS: 0.4 10*3/uL (ref 0.1–0.9)
Monocytes: 7 %
NEUTROS ABS: 3.2 10*3/uL (ref 1.4–7.0)
NEUTROS PCT: 61 %
PLATELETS: 309 10*3/uL (ref 150–450)
RBC: 4.01 x10E6/uL (ref 3.77–5.28)
RDW: 13.1 % (ref 12.3–15.4)
WBC: 5.4 10*3/uL (ref 3.4–10.8)

## 2018-10-24 LAB — LIPASE: Lipase: 41 U/L (ref 14–72)

## 2018-10-24 LAB — H. PYLORI ANTIBODY, IGG: H. pylori, IgG AbS: 0.23 Index Value (ref 0.00–0.79)

## 2018-10-26 ENCOUNTER — Encounter: Payer: Self-pay | Admitting: Family Medicine

## 2018-11-15 ENCOUNTER — Ambulatory Visit (INDEPENDENT_AMBULATORY_CARE_PROVIDER_SITE_OTHER): Payer: Managed Care, Other (non HMO) | Admitting: Gastroenterology

## 2018-11-15 ENCOUNTER — Other Ambulatory Visit: Payer: Self-pay

## 2018-11-15 ENCOUNTER — Encounter: Payer: Self-pay | Admitting: Gastroenterology

## 2018-11-15 VITALS — BP 138/77 | HR 87 | Ht 62.0 in | Wt 188.2 lb

## 2018-11-15 DIAGNOSIS — K219 Gastro-esophageal reflux disease without esophagitis: Secondary | ICD-10-CM | POA: Diagnosis not present

## 2018-11-15 NOTE — Progress Notes (Signed)
Gastroenterology Consultation  Referring Provider:     Lucille Passy, MD Primary Care Physician:  Lucille Passy, MD Primary Gastroenterologist:  Dr. Allen Norris     Reason for Consultation:     GERD and constipation        HPI:   Teresa Villarreal is a 47 y.o. y/o female referred for consultation & management of GERD and constipation by Dr. Deborra Medina, Marciano Sequin, MD.  The patient comes here today for evaluation of acid reflux and constipation.  The patient also reported dysphasia with coffee and feeling like she may have an ulcer.  The patient does endorse using NSAIDs.  The patient has tried Pepto-Bismol for her reflux and Pepcid without much help.  There is a report that the patient was helped somewhat by Prevacid. In addition to the patient constipation the patient also reports that she has bloating.  The patient also had blood work sent off by her PCP that showed the patient to have a normal CBC, CMP and normal lipase. The patient reports that she has burning in her esophagus and she also has had sores on her tongue.  She was taking her PPI and a H2 blocker and states that her symptoms improved greatly but then started to become bothersome when she stopped.  Past Medical History:  Diagnosis Date  . Chicken pox   . Depression   . Frequent headaches   . Genital warts   . History of kidney stones   . Hyperlipidemia     Past Surgical History:  Procedure Laterality Date  . CYSTOSCOPY  2010  . HYSTEROSCOPY  2010  . KNEE ARTHROSCOPY Left 1990    Prior to Admission medications   Medication Sig Start Date End Date Taking? Authorizing Provider  aspirin-acetaminophen-caffeine (EXCEDRIN MIGRAINE) [phone] MG tablet Take by mouth every 6 (six) hours as needed for headache.    [provider]  buPROPion (WELLBUTRIN XL) 300 MG 24 hr tablet Take 1 tablet (300 mg total) by mouth daily. 09/11/18   Lucille Passy, MD  dexmethylphenidate (FOCALIN) 10 MG tablet Take 1 tablet (10 mg total) by mouth 2 (two)  times daily. 09/11/18   Lucille Passy, MD  escitalopram (LEXAPRO) 20 MG tablet TAKE 1 TABLET BY MOUTH EVERY DAY 09/03/18   Lucille Passy, MD  famotidine (PEPCID) 10 MG tablet Take 10 mg by mouth 2 (two) times daily.    [provider]  fluticasone (FLONASE) 50 MCG/ACT nasal spray Place 2 sprays into both nostrils daily. 03/01/18   Lucille Passy, MD  ibuprofen (ADVIL,MOTRIN) 200 MG tablet Take 200 mg by mouth as needed.    [provider]  lansoprazole (PREVACID) 15 MG capsule Take 15 mg by mouth daily at 12 noon.    [provider]  lisinopril (PRINIVIL,ZESTRIL) 10 MG tablet Take 1 tablet (10 mg total) by mouth daily. 09/11/18   Lucille Passy, MD  nystatin (MYCOSTATIN) 100000 UNIT/ML suspension Use as directed 5 mLs (500,000 Units total) in the mouth or throat 4 (four) times daily. 09/27/18   Lucille Passy, MD  pseudoephedrine (SUDAFED) 30 MG tablet Take 30 mg by mouth as needed for congestion.    [provider]    Family History  Problem Relation Age of Onset  . Hyperlipidemia Mother   . Heart disease Mother   . Hypertension Mother   . Alzheimer's disease Mother   . COPD Mother   . Hyperlipidemia Father   . Hypertension Father   .  Mitochondrial disorder Brother        Mitochondrial encephalomyopathy lactic acidosis  . Breast cancer Paternal Aunt   . Arthritis Maternal Grandmother      Social History   Tobacco Use  . Smoking status: Never Smoker  . Smokeless tobacco: Never Used  Substance Use Topics  . Alcohol use: Yes    Alcohol/week: 0.0 standard drinks    Comment: occasional  . Drug use: No    Allergies as of 11/15/2018  . (No Known Allergies)    Review of Systems:    All systems reviewed and negative except where noted in HPI.   Physical Exam:  There were no vitals taken for this visit. No LMP recorded. General:   Alert,  Well-developed, well-nourished, pleasant and cooperative in NAD Head:  Normocephalic and atraumatic. Eyes:   Sclera clear, no icterus.   Conjunctiva pink. Ears:  Normal auditory acuity. Nose:  No deformity, discharge, or lesions. Mouth:  No deformity or lesions,oropharynx pink & moist. Neck:  Supple; no masses or thyromegaly. Lungs:  Respirations even and unlabored.  Clear throughout to auscultation.   No wheezes, crackles, or rhonchi. No acute distress. Heart:  Regular rate and rhythm; no murmurs, clicks, rubs, or gallops. Abdomen:  Normal bowel sounds.  No bruits.  Soft, non-tender and non-distended without masses, hepatosplenomegaly or hernias noted.  No guarding or rebound tenderness.  Negative Carnett sign.   Rectal:  Deferred.  Msk:  Symmetrical without gross deformities.  Good, equal movement & strength bilaterally. Pulses:  Normal pulses noted. Extremities:  No clubbing or edema.  No cyanosis. Neurologic:  Alert and oriented x3;  grossly normal neurologically. Skin:  Intact without significant lesions or rashes.  No jaundice. Lymph Nodes:  No significant cervical adenopathy. Psych:  Alert and cooperative. Normal mood and affect.  Imaging Studies: No results found.  Assessment and Plan:   Teresa Villarreal is a 47 y.o. y/o female Who comes in with signs and symptoms consistent with reflux.  The patient also reports that her father had a large hiatal hernia that needed surgery and the patient is quite concerned that she may have a hiatal hernia.  The patient will be started on a trial of Dexilant to be taken once a day.  The patient will also be set up for an EGD. I have discussed risks & benefits which include, but are not limited to, bleeding, infection, perforation & drug reaction.  The patient agrees with this plan & written consent will be obtained.     Lucilla Lame, MD. Marval Regal    Note: This dictation was prepared with Dragon dictation along with smaller phrase technology. Any transcriptional errors that result from this process are unintentional.

## 2018-11-16 ENCOUNTER — Other Ambulatory Visit: Payer: Self-pay

## 2018-11-16 DIAGNOSIS — K219 Gastro-esophageal reflux disease without esophagitis: Secondary | ICD-10-CM

## 2018-11-19 ENCOUNTER — Encounter: Payer: Self-pay | Admitting: *Deleted

## 2018-11-19 ENCOUNTER — Other Ambulatory Visit: Payer: Self-pay

## 2018-11-19 NOTE — Anesthesia Preprocedure Evaluation (Addendum)
Anesthesia Evaluation  Patient identified by MRN, date of birth, ID band Patient awake    Reviewed: Allergy & Precautions, NPO status , Patient's Chart, lab work & pertinent test results  History of Anesthesia Complications (+) PONV and history of anesthetic complications  Airway Mallampati: I   Neck ROM: Full    Dental no notable dental hx.    Pulmonary neg pulmonary ROS,    Pulmonary exam normal breath sounds clear to auscultation       Cardiovascular Exercise Tolerance: Good hypertension, Normal cardiovascular exam Rhythm:Regular Rate:Normal     Neuro/Psych  Headaches, PSYCHIATRIC DISORDERS Anxiety Depression    GI/Hepatic GERD  ,  Endo/Other  negative endocrine ROS  Renal/GU Renal disease (hx nephrolithiasis)     Musculoskeletal   Abdominal   Peds  Hematology negative hematology ROS (+)   Anesthesia Other Findings   Reproductive/Obstetrics                            Anesthesia Physical Anesthesia Plan  ASA: II  Anesthesia Plan: General   Post-op Pain Management:    Induction: Intravenous  PONV Risk Score and Plan: 3 and TIVA and Propofol infusion  Airway Management Planned: Natural Airway  Additional Equipment:   Intra-op Plan:   Post-operative Plan:   Informed Consent: I have reviewed the patients History and Physical, chart, labs and discussed the procedure including the risks, benefits and alternatives for the proposed anesthesia with the patient or authorized representative who has indicated his/her understanding and acceptance.     Plan Discussed with: CRNA  Anesthesia Plan Comments:        Anesthesia Quick Evaluation

## 2018-11-21 NOTE — Discharge Instructions (Signed)
General Anesthesia, Adult, Care After °These instructions provide you with information about caring for yourself after your procedure. Your health care provider may also give you more specific instructions. Your treatment has been planned according to current medical practices, but problems sometimes occur. Call your health care provider if you have any problems or questions after your procedure. °What can I expect after the procedure? °After the procedure, it is common to have: °· Vomiting. °· A sore throat. °· Mental slowness. ° °It is common to feel: °· Nauseous. °· Cold or shivery. °· Sleepy. °· Tired. °· Sore or achy, even in parts of your body where you did not have surgery. ° °Follow these instructions at home: °For at least 24 hours after the procedure: °· Do not: °? Participate in activities where you could fall or become injured. °? Drive. °? Use heavy machinery. °? Drink alcohol. °? Take sleeping pills or medicines that cause drowsiness. °? Make important decisions or sign legal documents. °? Take care of children on your own. °· Rest. °Eating and drinking °· If you vomit, drink water, juice, or soup when you can drink without vomiting. °· Drink enough fluid to keep your urine clear or pale yellow. °· Make sure you have little or no nausea before eating solid foods. °· Follow the diet recommended by your health care provider. °General instructions °· Have a responsible adult stay with you until you are awake and alert. °· Return to your normal activities as told by your health care provider. Ask your health care provider what activities are safe for you. °· Take over-the-counter and prescription medicines only as told by your health care provider. °· If you smoke, do not smoke without supervision. °· Keep all follow-up visits as told by your health care provider. This is important. °Contact a health care provider if: °· You continue to have nausea or vomiting at home, and medicines are not helpful. °· You  cannot drink fluids or start eating again. °· You cannot urinate after 8-12 hours. °· You develop a skin rash. °· You have fever. °· You have increasing redness at the site of your procedure. °Get help right away if: °· You have difficulty breathing. °· You have chest pain. °· You have unexpected bleeding. °· You feel that you are having a life-threatening or urgent problem. °This information is not intended to replace advice given to you by your health care provider. Make sure you discuss any questions you have with your health care provider. °Document Released: 03/06/2001 Document Revised: 05/02/2016 Document Reviewed: 11/12/2015 °Elsevier Interactive Patient Education © 2018 [employer] ° °

## 2018-11-22 ENCOUNTER — Ambulatory Visit: Payer: Managed Care, Other (non HMO) | Admitting: Anesthesiology

## 2018-11-22 ENCOUNTER — Ambulatory Visit
Admission: RE | Admit: 2018-11-22 | Discharge: 2018-11-22 | Disposition: A | Discharge status: Home / Self Care | Payer: Managed Care, Other (non HMO) | Attending: Gastroenterology | Admitting: Gastroenterology

## 2018-11-22 ENCOUNTER — Encounter
Admission: RE | Disposition: A | Discharge status: Home / Self Care | Payer: Self-pay | Source: Home / Self Care | Attending: Gastroenterology

## 2018-11-22 DIAGNOSIS — K219 Gastro-esophageal reflux disease without esophagitis: Secondary | ICD-10-CM

## 2018-11-22 DIAGNOSIS — F329 Major depressive disorder, single episode, unspecified: Secondary | ICD-10-CM | POA: Insufficient documentation

## 2018-11-22 DIAGNOSIS — Z7982 Long term (current) use of aspirin: Secondary | ICD-10-CM | POA: Insufficient documentation

## 2018-11-22 DIAGNOSIS — Z79899 Other long term (current) drug therapy: Secondary | ICD-10-CM | POA: Diagnosis not present

## 2018-11-22 DIAGNOSIS — R12 Heartburn: Secondary | ICD-10-CM | POA: Insufficient documentation

## 2018-11-22 DIAGNOSIS — Z7951 Long term (current) use of inhaled steroids: Secondary | ICD-10-CM | POA: Insufficient documentation

## 2018-11-22 DIAGNOSIS — I10 Essential (primary) hypertension: Secondary | ICD-10-CM | POA: Insufficient documentation

## 2018-11-22 DIAGNOSIS — Z8249 Family history of ischemic heart disease and other diseases of the circulatory system: Secondary | ICD-10-CM | POA: Insufficient documentation

## 2018-11-22 HISTORY — PX: ESOPHAGOGASTRODUODENOSCOPY (EGD) WITH PROPOFOL: SHX5813

## 2018-11-22 HISTORY — DX: Other specified postprocedural states: Z98.890

## 2018-11-22 HISTORY — DX: Other specified postprocedural states: R11.2

## 2018-11-22 HISTORY — DX: Motion sickness, initial encounter: T75.3XXA

## 2018-11-22 HISTORY — DX: Essential (primary) hypertension: I10

## 2018-11-22 SURGERY — ESOPHAGOGASTRODUODENOSCOPY (EGD) WITH PROPOFOL
Anesthesia: General | Site: Throat

## 2018-11-22 MED ORDER — STERILE WATER FOR IRRIGATION IR SOLN
Status: DC | PRN
  Administered 2018-11-22: 12:00:00

## 2018-11-22 MED ORDER — ACETAMINOPHEN 160 MG/5ML PO SOLN: 325.0000 mg | ORAL | Status: DC | PRN

## 2018-11-22 MED ORDER — PROPOFOL 10 MG/ML IV BOLUS
INTRAVENOUS | Status: DC | PRN
  Administered 2018-11-22: 120 mg via INTRAVENOUS
  Administered 2018-11-22: 20 mg via INTRAVENOUS
  Administered 2018-11-22: 40 mg via INTRAVENOUS

## 2018-11-22 MED ORDER — SODIUM CHLORIDE 0.9 % IV SOLN: INTRAVENOUS | Status: DC

## 2018-11-22 MED ORDER — LIDOCAINE HCL (CARDIAC) PF 100 MG/5ML IV SOSY
PREFILLED_SYRINGE | INTRAVENOUS | Status: DC | PRN
  Administered 2018-11-22: 30 mg via INTRAVENOUS

## 2018-11-22 MED ORDER — LACTATED RINGERS IV SOLN
INTRAVENOUS | Status: DC
  Administered 2018-11-22: 12:00:00 via INTRAVENOUS

## 2018-11-22 MED ORDER — ACETAMINOPHEN 325 MG PO TABS: 650.0000 mg | ORAL_TABLET | Freq: Once | ORAL | Status: DC | PRN

## 2018-11-22 MED ORDER — ONDANSETRON HCL 4 MG/2ML IJ SOLN: 4.0000 mg | Freq: Once | INTRAMUSCULAR | Status: DC | PRN

## 2018-11-22 MED ORDER — GLYCOPYRROLATE 0.2 MG/ML IJ SOLN
INTRAMUSCULAR | Status: DC | PRN
  Administered 2018-11-22: 0.1 mg via INTRAVENOUS

## 2018-11-22 SURGICAL SUPPLY — 7 items
BLOCK BITE 60FR ADLT L/F GRN (MISCELLANEOUS) ×2 IMPLANT
CANISTER SUCT 1200ML W/VALVE (MISCELLANEOUS) ×2 IMPLANT
FORCEPS BIOP RAD 4 LRG CAP 4 (CUTTING FORCEPS) ×2 IMPLANT
GOWN CVR UNV OPN BCK APRN NK (MISCELLANEOUS) ×2 IMPLANT
GOWN ISOL THUMB LOOP REG UNIV (MISCELLANEOUS) ×2
KIT ENDO PROCEDURE OLY (KITS) ×2 IMPLANT
WATER STERILE IRR 250ML POUR (IV SOLUTION) ×2 IMPLANT

## 2018-11-22 NOTE — H&P (Signed)
Teresa Lame, MD [address] [place], [place] [address] [phone] Fax : [phone]  Primary Care Physician:  Lucille Passy, MD Primary Gastroenterologist:  Dr. Allen Norris  Pre-Procedure History & Physical: HPI:  Teresa Villarreal is a 47 y.o. female is here for an endoscopy.   Past Medical History:  Diagnosis Date  . Chicken pox   . Depression   . Frequent headaches    stress, approx 2x/wk  . Genital warts   . History of kidney stones   . Hyperlipidemia   . Hypertension   . Motion sickness    amusement park rides  . PONV (postoperative nausea and vomiting)     Past Surgical History:  Procedure Laterality Date  . CYSTOSCOPY  2010  . HYSTEROSCOPY  2010  . KNEE ARTHROSCOPY Left 1990    Prior to Admission medications   Medication Sig Start Date End Date Taking? Authorizing Provider  aspirin-acetaminophen-caffeine (EXCEDRIN MIGRAINE) [phone] MG tablet Take by mouth every 6 (six) hours as needed for headache.   Yes [provider]  buPROPion (WELLBUTRIN XL) 300 MG 24 hr tablet Take 1 tablet (300 mg total) by mouth daily. 09/11/18  Yes Lucille Passy, MD  Dexlansoprazole 30 MG capsule Take 30 mg by mouth daily.   Yes [provider]  dexmethylphenidate (FOCALIN) 10 MG tablet Take 1 tablet (10 mg total) by mouth 2 (two) times daily. 09/11/18  Yes Lucille Passy, MD  escitalopram (LEXAPRO) 20 MG tablet TAKE 1 TABLET BY MOUTH EVERY DAY 09/03/18  Yes Lucille Passy, MD  famotidine (PEPCID) 10 MG tablet Take 10 mg by mouth 2 (two) times daily.   Yes [provider]  fluticasone (FLONASE) 50 MCG/ACT nasal spray Place 2 sprays into both nostrils daily. 03/01/18  Yes Lucille Passy, MD  ibuprofen (ADVIL,MOTRIN) 200 MG tablet Take 200 mg by mouth as needed.   Yes [provider]  lansoprazole (PREVACID) 15 MG capsule Take 15 mg by mouth daily at 12 noon.   Yes [provider]  lisinopril (PRINIVIL,ZESTRIL) 10 MG tablet Take 1  tablet (10 mg total) by mouth daily. 09/11/18  Yes Lucille Passy, MD  pseudoephedrine (SUDAFED) 30 MG tablet Take 30 mg by mouth as needed for congestion.   Yes [provider]  nystatin (MYCOSTATIN) 100000 UNIT/ML suspension Use as directed 5 mLs (500,000 Units total) in the mouth or throat 4 (four) times daily. Patient not taking: Reported on 11/15/2018 09/27/18   Lucille Passy, MD    Allergies as of 11/16/2018  . (No Known Allergies)    Family History  Problem Relation Age of Onset  . Hyperlipidemia Mother   . Heart disease Mother   . Hypertension Mother   . Alzheimer's disease Mother   . COPD Mother   . Hyperlipidemia Father   . Hypertension Father   . Mitochondrial disorder Brother        Mitochondrial encephalomyopathy lactic acidosis  . Breast cancer Paternal Aunt   . Arthritis Maternal Grandmother     Social History   Socioeconomic History  . Marital status: Married    Spouse name: Not on file  . Number of children: Not on file  . Years of education: Not on file  . Highest education level: Not on file  Occupational History  . Not on file  Social Needs  . Financial resource strain: Not on file  . Food insecurity:    Worry: Not on file  Inability: Not on file  . Transportation needs:    Medical: Not on file    Non-medical: Not on file  Tobacco Use  . Smoking status: Never Smoker  . Smokeless tobacco: Never Used  Substance and Sexual Activity  . Alcohol use: Yes    Alcohol/week: 0.0 standard drinks    Comment: occasional - 1x/mo  . Drug use: No  . Sexual activity: Yes  Lifestyle  . Physical activity:    Days per week: Not on file    Minutes per session: Not on file  . Stress: Not on file  Relationships  . Social connections:    Talks on phone: Not on file    Gets together: Not on file    Attends religious service: Not on file    Active member of club or organization: Not on file    Attends meetings of clubs or organizations: Not on file     Relationship status: Not on file  . Intimate partner violence:    Fear of current or ex partner: Not on file    Emotionally abused: Not on file    Physically abused: Not on file    Forced sexual activity: Not on file  Other Topics Concern  . Not on file  Social History Narrative  . Not on file    Review of Systems: See HPI, otherwise negative ROS  Physical Exam: BP (!) 144/89   Pulse 67   Temp 98.1 F (36.7 C) (Temporal)   Resp 16   Ht 5\' 5"  (1.651 m)   Wt 83 kg   LMP 11/09/2018 (Exact Date) Comment: preg test neg  SpO2 99%   BMI 30.45 kg/m  General:   Alert,  pleasant and cooperative in NAD Head:  Normocephalic and atraumatic. Neck:  Supple; no masses or thyromegaly. Lungs:  Clear throughout to auscultation.    Heart:  Regular rate and rhythm. Abdomen:  Soft, nontender and nondistended. Normal bowel sounds, without guarding, and without rebound.   Neurologic:  Alert and  oriented x4;  grossly normal neurologically.  Impression/Plan: Teresa Villarreal is here for an endoscopy to be performed for GERD  Risks, benefits, limitations, and alternatives regarding  endoscopy have been reviewed with the patient.  Questions have been answered.  All parties agreeable.   Teresa Lame, MD  11/22/2018, 12:15 PM

## 2018-11-22 NOTE — Anesthesia Postprocedure Evaluation (Signed)
Anesthesia Post Note  Patient: Teresa Villarreal  Procedure(s) Performed: ESOPHAGOGASTRODUODENOSCOPY (EGD) WITH PROPOFOL (N/A Throat)  Patient location during evaluation: PACU Anesthesia Type: General Level of consciousness: awake and alert, oriented and patient cooperative Pain management: pain level controlled Vital Signs Assessment: post-procedure vital signs reviewed and stable Respiratory status: spontaneous breathing, nonlabored ventilation and respiratory function stable Cardiovascular status: blood pressure returned to baseline and stable Postop Assessment: adequate PO intake Anesthetic complications: no    Darrin Nipper

## 2018-11-22 NOTE — Op Note (Signed)
[facility] Gastroenterology Patient Name: Teresa Villarreal Procedure Date: 11/22/2018 12:21 PM MRN: [id number] Account #: [id number] Date of Birth: Dec 30, 1970 Admit Type: Outpatient Age: 47 Room: [facility] OR ROOM 01 Gender: Female Note Status: Finalized Procedure:            Upper GI endoscopy Indications:          Heartburn Providers:            Lucilla Lame MD, MD Referring MD:         Marciano Sequin. Deborra Medina (Referring MD) Medicines:            Propofol per Anesthesia Complications:        No immediate complications. Procedure:            Pre-Anesthesia Assessment:                       - Prior to the procedure, a History and Physical was                        performed, and patient medications and allergies were                        reviewed. The patient's tolerance of previous                        anesthesia was also reviewed. The risks and benefits of                        the procedure and the sedation options and risks were                        discussed with the patient. All questions were                        answered, and informed consent was obtained. Prior                        Anticoagulants: The patient has taken no previous                        anticoagulant or antiplatelet agents. ASA Grade                        Assessment: II - A patient with mild systemic disease.                        After reviewing the risks and benefits, the patient was                        deemed in satisfactory condition to undergo the                        procedure.                       After obtaining informed consent, the endoscope was                        passed under direct vision. Throughout the procedure,  the patient's blood pressure, pulse, and oxygen                        saturations were monitored continuously. The was                        introduced through the mouth, and advanced to the                        second part of  duodenum. The upper GI endoscopy was                        accomplished without difficulty. The patient tolerated                        the procedure well. Findings:      The examined esophagus was normal. Two biopsies were obtained in the       middle third of the esophagus with cold forceps for histology.      The entire examined stomach was normal.      The examined duodenum was normal. Impression:           - Normal esophagus.                       - Normal stomach.                       - Normal examined duodenum.                       - Two biopsies were obtained in the middle third of the                        esophagus. Recommendation:       - Discharge patient to home.                       - Resume previous diet.                       - Continue present medications.                       - Await pathology results. Procedure Code(s):    --- Professional ---                       [phone], Esophagogastroduodenoscopy, flexible, transoral;                        with biopsy, single or multiple Diagnosis Code(s):    --- Professional ---                       R12, Heartburn CPT copyright 2018 American Medical Association. All rights reserved. The codes documented in this report are preliminary and upon coder review may  be revised to meet current compliance requirements. Lucilla Lame MD, MD 11/22/2018 12:31:57 PM This report has been signed electronically. Number of Addenda: 0 Note Initiated On: 11/22/2018 12:21 PM      [facility]

## 2018-11-22 NOTE — Transfer of Care (Signed)
Immediate Anesthesia Transfer of Care Note  Patient: Teresa Villarreal  Procedure(s) Performed: ESOPHAGOGASTRODUODENOSCOPY (EGD) WITH PROPOFOL (N/A Throat)  Patient Location: PACU  Anesthesia Type: General  Level of Consciousness: awake, alert  and patient cooperative  Airway and Oxygen Therapy: Patient Spontanous Breathing and Patient connected to supplemental oxygen  Post-op Assessment: Post-op Vital signs reviewed, Patient's Cardiovascular Status Stable, Respiratory Function Stable, Patent Airway and No signs of Nausea or vomiting  Post-op Vital Signs: Reviewed and stable  Complications: No apparent anesthesia complications

## 2018-11-22 NOTE — Anesthesia Procedure Notes (Signed)
Performed by: Ileana Chalupa, CRNA Pre-anesthesia Checklist: Patient identified, Emergency Drugs available, Suction available, Timeout performed and Patient being monitored Patient Re-evaluated:Patient Re-evaluated prior to induction Oxygen Delivery Method: Nasal cannula Placement Confirmation: positive ETCO2       

## 2018-11-23 ENCOUNTER — Encounter: Payer: Self-pay | Admitting: Gastroenterology

## 2018-11-28 ENCOUNTER — Encounter

## 2018-11-28 ENCOUNTER — Ambulatory Visit: Payer: Self-pay | Admitting: Gastroenterology

## 2018-11-29 ENCOUNTER — Telehealth: Payer: Self-pay | Admitting: Gastroenterology

## 2018-11-29 ENCOUNTER — Other Ambulatory Visit: Payer: Self-pay

## 2018-11-29 MED ORDER — DEXLANSOPRAZOLE 60 MG PO CPDR: 60.0000 mg | DELAYED_RELEASE_CAPSULE | Freq: Every day | ORAL | 6 refills | Status: DC

## 2018-11-29 NOTE — Telephone Encounter (Signed)
Pt called the office to inform Ginger of the discomfort in her am. Says it's the near the IV site. Pt believes it's a reaction from the procedure she rec a few days ago. Pls call

## 2018-11-29 NOTE — Progress Notes (Signed)
d 

## 2018-11-30 NOTE — Telephone Encounter (Signed)
Advised pt I'm not sure if the reaction she is having is coming from her IV site as it has been a week since her procedure. Advised her to continue to ice the area and let me know if redness and swelling continues.

## 2019-02-01 ENCOUNTER — Encounter: Payer: Self-pay | Admitting: Family Medicine

## 2019-02-02 ENCOUNTER — Other Ambulatory Visit: Payer: Self-pay | Admitting: Family Medicine

## 2019-02-02 MED ORDER — DEXMETHYLPHENIDATE HCL 10 MG PO TABS: 10.0000 mg | ORAL_TABLET | Freq: Two times a day (BID) | ORAL | 0 refills | Status: DC

## 2019-02-11 ENCOUNTER — Encounter: Payer: Self-pay | Admitting: Family Medicine

## 2019-02-12 ENCOUNTER — Other Ambulatory Visit: Payer: Self-pay | Admitting: Family Medicine

## 2019-02-12 MED ORDER — BENZONATATE 200 MG PO CAPS: 200.0000 mg | ORAL_CAPSULE | Freq: Two times a day (BID) | ORAL | 0 refills | Status: DC | PRN

## 2019-02-12 MED ORDER — AMOXICILLIN-POT CLAVULANATE 875-125 MG PO TABS: 1.0000 | ORAL_TABLET | Freq: Two times a day (BID) | ORAL | 0 refills | Status: AC

## 2019-02-26 ENCOUNTER — Other Ambulatory Visit: Payer: Self-pay | Admitting: Family Medicine

## 2019-02-26 MED ORDER — DEXMETHYLPHENIDATE HCL 10 MG PO TABS: 10.0000 mg | ORAL_TABLET | Freq: Two times a day (BID) | ORAL | 0 refills | Status: DC

## 2019-02-26 NOTE — Telephone Encounter (Signed)
TA-Plz see pt request for Dexmethylphenidate/per  PMP pt is compliant without any red flags/thx dmf

## 2019-04-05 ENCOUNTER — Other Ambulatory Visit: Payer: Self-pay | Admitting: Family Medicine

## 2019-04-05 MED ORDER — DEXMETHYLPHENIDATE HCL 10 MG PO TABS: 10.0000 mg | ORAL_TABLET | Freq: Two times a day (BID) | ORAL | 0 refills | Status: DC

## 2019-04-05 NOTE — Telephone Encounter (Signed)
Called Pt to schedule OV ( Virtual visit) . Pt hasn't been seen since 09/2018. Pt verified that she has enough meds until her scheduled appt on 04/08/2019.

## 2019-04-07 NOTE — Progress Notes (Addendum)
Virtual Visit via Video   Due to the COVID-19 pandemic, this visit was completed with telemedicine (audio/video) technology to reduce patient and provider exposure as well as to preserve personal protective equipment.   I connected with Teresa Villarreal on 04/08/19 at  8:40 AM EDT by a video enabled telemedicine application and verified that I am speaking with the correct person using two identifiers. Location patient: Home Location provider: [place] [facility], Office Persons participating in the virtual visit: Teresa Villarreal, Teresa Norris, MD   I discussed the limitations of evaluation and management by telemedicine and the availability of in person appointments. The patient expressed understanding and agreed to proceed.  Care Team   Patient Care Team: Teresa Passy, MD as PCP - General (Family Medicine)  Subjective:   HPI:  Allergic rhinitis- Currently having worsening symptoms.  Currently using sudafed and flonase daily.  Having daily "sinus headaches for weeks."  Not worsening, no fevers, or tooth pain.  No SOB or cough.  ADD- Has been well controlled on current dose of focalin.  Due for update of UDS/CSC.   Review of Systems  Constitutional: Negative.   HENT: Positive for congestion and sinus pain. Negative for ear discharge, ear pain, hearing loss, nosebleeds, sore throat and tinnitus.   Eyes: Negative.   Respiratory: Negative.  Negative for stridor.   Cardiovascular: Negative.   Gastrointestinal: Negative.   Genitourinary: Negative.   Musculoskeletal: Negative.   Skin: Negative.   Neurological: Positive for headaches. Negative for dizziness, tingling, tremors, sensory change, speech change, focal weakness, seizures, loss of consciousness and weakness.  Endo/Heme/Allergies: Negative.   Psychiatric/Behavioral: Negative.   All other systems reviewed and are negative.    Patient Active Problem List   Diagnosis Date Noted  . Allergic rhinitis 04/08/2019  . GERD (gastroesophageal  reflux disease) 10/22/2018  . Fatigue 10/22/2018  . Essential hypertension 05/12/2017  . 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 Anxiety and depression 07/29/2015  . Frequent headaches 07/29/2015  . HLD (hyperlipidemia) 07/29/2015    Social History   Tobacco Use  . Smoking status: Never Smoker  . Smokeless tobacco: Never Used  Substance Use Topics  . Alcohol use: Yes    Alcohol/week: 0.0 standard drinks    Comment: occasional - 1x/mo    Current Outpatient Medications:  .  aspirin-acetaminophen-caffeine (EXCEDRIN MIGRAINE) 250-250-65 MG tablet, Take by mouth every 6 (six) hours as needed for headache., Disp: , Rfl:  .  benzonatate (TESSALON) 200 MG capsule, Take 1 capsule (200 mg total) by mouth 2 (two) times daily as needed for cough., Disp: 20 capsule, Rfl: 0 .  buPROPion (WELLBUTRIN XL) 300 MG 24 hr tablet, Take 1 tablet (300 mg total) by mouth daily., Disp: 90 tablet, Rfl: 1 .  dexlansoprazole (DEXILANT) 60 MG capsule, Take 1 capsule (60 mg total) by mouth daily., Disp: 30 capsule, Rfl: 6 .  dexmethylphenidate (FOCALIN) 10 MG tablet, Take 1 tablet (10 mg total) by mouth 2 (two) times daily., Disp: 60 tablet, Rfl: 0 .  escitalopram (LEXAPRO) 20 MG tablet, TAKE 1 TABLET BY MOUTH EVERY DAY, Disp: 90 tablet, Rfl: 1 .  famotidine (PEPCID) 10 MG tablet, Take 10 mg by mouth 2 (two) times daily., Disp: , Rfl:  .  fluticasone (FLONASE) 50 MCG/ACT nasal spray, Place 2 sprays into both nostrils daily., Disp: 16 g, Rfl: PRN .  ibuprofen (ADVIL,MOTRIN) 200 MG tablet, Take 200 mg by mouth as needed., Disp: , Rfl:  .  lansoprazole (  PREVACID) 15 MG capsule, Take 15 mg by mouth daily at 12 noon., Disp: , Rfl:  .  lisinopril (PRINIVIL,ZESTRIL) 10 MG tablet, Take 1 tablet (10 mg total) by mouth daily., Disp: 90 tablet, Rfl: 3 .  nystatin (MYCOSTATIN) 100000 UNIT/ML suspension, Use as directed 5 mLs (500,000 Units total) in the mouth or throat 4  (four) times daily. (Patient not taking: Reported on 11/15/2018), Disp: 473 mL, Rfl: 0 .  pseudoephedrine (SUDAFED) 30 MG tablet, Take 30 mg by mouth as needed for congestion., Disp: , Rfl:   No Known Allergies  Objective:  There were no vitals taken for this visit.  VITALS: Per patient if applicable, see vitals. GENERAL: Alert, appears well and in no acute distress. HEENT: Atraumatic, conjunctiva clear, no obvious abnormalities on inspection of external nose and ears. NECK: Normal movements of the head and neck. CARDIOPULMONARY: No increased WOB. Speaking in clear sentences. I:E ratio WNL.  MS: Moves all visible extremities without noticeable abnormality. PSYCH: Pleasant and cooperative, well-groomed. Speech normal rate and rhythm. Affect is appropriate. Insight and judgement are appropriate. Attention is focused, linear, and appropriate.  NEURO: CN grossly intact. Oriented as arrived to appointment on time with no prompting. Moves both UE equally.  SKIN: No obvious lesions, wounds, erythema, or cyanosis noted on face or hands.  Depression screen [facility] 2/9 09/11/2018 08/08/2018 10/27/2016  Decreased Interest 1 2 0  Down, Depressed, Hopeless 1 2 0  PHQ - 2 Score 2 4 0  Altered sleeping 0 3 -  Tired, decreased energy 1 3 -  Change in appetite 0 2 -  Feeling bad or failure about yourself  0 3 -  Trouble concentrating 1 2 -  Moving slowly or fidgety/restless 0 1 -  Suicidal thoughts 0 0 -  PHQ-9 Score 4 18 -  Difficult doing work/chores Not difficult at all Extremely dIfficult -    Assessment and Plan:   [name] was seen today for follow-up.  Diagnoses and all orders for this visit:  Attention deficit hyperactivity disorder (ADHD), predominantly inattentive type  Allergic rhinitis due to pollen, unspecified seasonality    . COVID-19 Education:The signs and symptoms of COVID-19 were discussed with the patient and how to seek care for testing if needed. The importance of social  distancing was discussed today. . Reviewed expectations re: course of current medical issues. . Discussed self-management of symptoms. . Outlined signs and symptoms indicating need for more acute intervention. . Patient verbalized understanding and all questions were answered. [name] Kitchen Health Maintenance issues including appropriate healthy diet, exercise, and smoking avoidance were discussed with patient. . See orders for this visit as documented in the electronic medical record.  Teresa Norris, MD 04/08/2019  No charge today as she cannot update her UDS/CCS online.

## 2019-04-08 ENCOUNTER — Ambulatory Visit: Payer: Managed Care, Other (non HMO) | Admitting: Family Medicine

## 2019-04-08 DIAGNOSIS — F900 Attention-deficit hyperactivity disorder, predominantly inattentive type: Secondary | ICD-10-CM

## 2019-04-08 DIAGNOSIS — J309 Allergic rhinitis, unspecified: Secondary | ICD-10-CM | POA: Insufficient documentation

## 2019-04-08 DIAGNOSIS — J301 Allergic rhinitis due to pollen: Secondary | ICD-10-CM

## 2019-04-08 NOTE — Assessment & Plan Note (Signed)
Deteriorated. Advised adding zyrtec or another antihistamine to what she is currently taking- flonase and sudafed. Call or send my chart message prn if these symptoms worsen or fail to improve as anticipated.' The patient indicates understanding of these issues and agrees with the plan.

## 2019-04-08 NOTE — Assessment & Plan Note (Signed)
Well controlled on current dose of focalin. PDMP reviewed- no red flags.  She will make an appt in a few months for OV to update CSC/UDS. The patient indicates understanding of these issues and agrees with the plan.

## 2019-04-30 ENCOUNTER — Encounter: Payer: Self-pay | Admitting: Family Medicine

## 2019-04-30 ENCOUNTER — Other Ambulatory Visit: Payer: Self-pay

## 2019-04-30 MED ORDER — ESCITALOPRAM OXALATE 20 MG PO TABS: ORAL_TABLET | ORAL | 1 refills | Status: DC

## 2019-05-06 ENCOUNTER — Encounter: Payer: Self-pay | Admitting: Family Medicine

## 2019-05-07 ENCOUNTER — Other Ambulatory Visit: Payer: Self-pay

## 2019-05-07 MED ORDER — DEXMETHYLPHENIDATE HCL 10 MG PO TABS: 10.0000 mg | ORAL_TABLET | Freq: Two times a day (BID) | ORAL | 0 refills | Status: DC

## 2019-05-07 NOTE — Telephone Encounter (Signed)
TA-Pt requesting Focalin/Per [place] PMP pt is compliant without red flags/I have prepared and pended May and June with note for pt sched visit in June/thx dmf

## 2019-05-07 NOTE — Addendum Note (Signed)
Addended by: Lucille Passy on: 05/07/2019 12:29 PM   Modules accepted: Level of Service

## 2019-06-05 ENCOUNTER — Encounter: Payer: Self-pay | Admitting: Family Medicine

## 2019-06-06 ENCOUNTER — Other Ambulatory Visit: Payer: Self-pay | Admitting: Family Medicine

## 2019-07-05 ENCOUNTER — Telehealth: Payer: Self-pay

## 2019-07-05 ENCOUNTER — Encounter: Payer: Self-pay | Admitting: Family Medicine

## 2019-07-05 NOTE — Telephone Encounter (Signed)
TA-Pt req refill of Dexmethylphenidate/LOV 4.27.20/Per [place] PMP pt is compliant without red flags/Rx's prepared and pended for your approval/thx dmf

## 2019-07-08 MED ORDER — DEXMETHYLPHENIDATE HCL 10 MG PO TABS: 10.0000 mg | ORAL_TABLET | Freq: Two times a day (BID) | ORAL | 0 refills | Status: DC

## 2019-08-07 ENCOUNTER — Other Ambulatory Visit: Payer: Self-pay | Admitting: Family Medicine

## 2019-08-07 ENCOUNTER — Encounter: Payer: Self-pay | Admitting: Family Medicine

## 2019-08-07 MED ORDER — DEXMETHYLPHENIDATE HCL 10 MG PO TABS: 10.0000 mg | ORAL_TABLET | Freq: Two times a day (BID) | ORAL | 0 refills | Status: DC

## 2019-08-08 NOTE — Telephone Encounter (Signed)
Pt states the pharmacy this medication was called in to is out of stock and they will not have any until Monday.  Pt wants to know if it can be sent to  [id number] [place], [place] [phone] (Phone) [phone] (Fax)   Pt would like a call once this has been done so that she can know to go pick up. Pt can be reached at [phone]

## 2019-08-09 MED ORDER — DEXMETHYLPHENIDATE HCL 10 MG PO TABS: 10.0000 mg | ORAL_TABLET | Freq: Two times a day (BID) | ORAL | 0 refills | Status: DC

## 2019-08-09 NOTE — Telephone Encounter (Signed)
Dr. Aron - please advise 

## 2019-08-09 NOTE — Telephone Encounter (Signed)
Yes I will take care of it now.  Thank you!

## 2019-08-09 NOTE — Addendum Note (Signed)
Addended by: Lucille Passy on: 08/09/2019 08:55 AM   Modules accepted: Orders

## 2019-08-15 ENCOUNTER — Other Ambulatory Visit: Payer: Self-pay

## 2019-08-15 DIAGNOSIS — R109 Unspecified abdominal pain: Secondary | ICD-10-CM

## 2019-08-15 DIAGNOSIS — R350 Frequency of micturition: Secondary | ICD-10-CM

## 2019-08-17 ENCOUNTER — Encounter: Payer: Self-pay | Admitting: Emergency Medicine

## 2019-08-17 ENCOUNTER — Other Ambulatory Visit: Payer: Self-pay | Admitting: Family Medicine

## 2019-08-17 ENCOUNTER — Emergency Department
Admission: EM | Admit: 2019-08-17 | Discharge: 2019-08-17 | Disposition: A | Discharge status: Home / Self Care | Payer: Managed Care, Other (non HMO) | Attending: Emergency Medicine | Admitting: Emergency Medicine

## 2019-08-17 ENCOUNTER — Other Ambulatory Visit: Payer: Self-pay

## 2019-08-17 DIAGNOSIS — R300 Dysuria: Secondary | ICD-10-CM | POA: Diagnosis present

## 2019-08-17 DIAGNOSIS — Z5321 Procedure and treatment not carried out due to patient leaving prior to being seen by health care provider: Secondary | ICD-10-CM | POA: Insufficient documentation

## 2019-08-17 LAB — URINALYSIS, COMPLETE (UACMP) WITH MICROSCOPIC
Bilirubin Urine: NEGATIVE
Glucose, UA: NEGATIVE mg/dL
Ketones, ur: NEGATIVE mg/dL
Nitrite: POSITIVE — AB
Protein, ur: 30 mg/dL — AB
Specific Gravity, Urine: 1.01 (ref 1.005–1.030)
WBC, UA: >50 WBC/hpf — ABNORMAL HIGH (ref 0–5)
pH: 6 (ref 5.0–8.0)

## 2019-08-17 MED ORDER — FLUCONAZOLE 150 MG PO TABS: 150.0000 mg | ORAL_TABLET | Freq: Once | ORAL | 1 refills | Status: AC

## 2019-08-17 MED ORDER — FLUCONAZOLE 150 MG PO TABS: 150.0000 mg | ORAL_TABLET | Freq: Once | ORAL | 1 refills | Status: DC

## 2019-08-17 MED ORDER — CEPHALEXIN 500 MG PO CAPS: 500.0000 mg | ORAL_CAPSULE | Freq: Two times a day (BID) | ORAL | 0 refills | Status: DC

## 2019-08-17 MED ORDER — ONDANSETRON HCL 8 MG PO TABS: 8.0000 mg | ORAL_TABLET | Freq: Three times a day (TID) | ORAL | 0 refills | Status: DC | PRN

## 2019-08-17 NOTE — ED Notes (Signed)
Pt came up to desk stating her doctor called her with results from the UA that was done yesterday; she does have a UTI and her doctor is going to call in a prescription for her; pt is not going to stay; attempted to cancel urine order, called lab, but was unsuccessful;

## 2019-08-17 NOTE — ED Triage Notes (Signed)
Patient states that she started having pain with urination on Monday. Patient states that Wednesday night she felt like she passed a kidney stone. Patient states that Wednesday she had bilateral flank pain and that it felt like previous stones. Patient states that she continues to have pain with urination. Patient states that she saw her PCP yesterday and had a UA done but the results will not be back until Monday. Patient states that today she has had some nausea and a fever 99.6.

## 2019-08-18 ENCOUNTER — Other Ambulatory Visit: Payer: Self-pay | Admitting: Family Medicine

## 2019-08-18 LAB — URINALYSIS, ROUTINE W REFLEX MICROSCOPIC
Bilirubin, UA: NEGATIVE
Glucose, UA: NEGATIVE
Ketones, UA: NEGATIVE
Nitrite, UA: POSITIVE — AB
Specific Gravity, UA: 1.016 (ref 1.005–1.030)
Urobilinogen, Ur: 1 mg/dL (ref 0.2–1.0)
pH, UA: 5.5 (ref 5.0–7.5)

## 2019-08-18 LAB — URINE CULTURE

## 2019-08-18 LAB — MICROSCOPIC EXAMINATION
Casts: NONE SEEN /lpf
RBC, Urine: >30 /hpf — AB (ref 0–2)
WBC, UA: >30 /hpf — AB (ref 0–5)

## 2019-08-18 MED ORDER — CIPROFLOXACIN HCL 500 MG PO TABS: 500.0000 mg | ORAL_TABLET | Freq: Two times a day (BID) | ORAL | 0 refills | Status: DC

## 2019-08-19 DIAGNOSIS — N12 Tubulo-interstitial nephritis, not specified as acute or chronic: Secondary | ICD-10-CM | POA: Insufficient documentation

## 2019-08-19 HISTORY — DX: Tubulo-interstitial nephritis, not specified as acute or chronic: N12

## 2019-08-19 NOTE — Assessment & Plan Note (Addendum)
>  40 minutes spent in face to face time with patient, >50% spent in counselling or coordination of care discussing her results, course of her illness and how she is doing now.  Unfortunately, she was under treated for pyelonephritis as she was not taking the abx as instructed.   She is now and fevers, chills, vomiting, dysuria all resolved.  My concern is what if she also has a kidney stone- she is having cramping in her lower back, history of kidney stones.  She really needs a stat renal stone CT.  Order entered. The patient indicates understanding of these issues and agrees with the plan. Orders Placed This Encounter  Procedures  . CT RENAL STONE STUDY

## 2019-08-19 NOTE — Progress Notes (Signed)
Virtual Visit via Video   Due to the COVID-19 pandemic, this visit was completed with telemedicine (audio/video) technology to reduce patient and provider exposure as well as to preserve personal protective equipment.   I connected with Chaniece Fosdick by a video enabled telemedicine application and verified that I am speaking with the correct person using two identifiers. Location patient: Home Location provider: [facility], Office Persons participating in the virtual visit: Patches Donaghy, Arnette Norris, MD   I discussed the limitations of evaluation and management by telemedicine and the availability of in person appointments. The patient expressed understanding and agreed to proceed.  Care Team   Patient Care Team: Lucille Passy, MD as PCP - General (Family Medicine)  Subjective:   HPI:   Pyelenophretis- very complicated situation. Patient called on 9/3 about dysuria and urinary frequency.  I advised having her come right in for UA and urine cx.  Unfortunately, labs did not result until over the weekend, on Saturday, 9/5.  So since it was a long weekend, I called her the minute I saw the results.  She was at the ED by then because triage nurse at our office said ti was not back yet by Friday, which was true despite her leaving the sample on Friday early in the morning.  She had not seen yet when I reached her but she had a fever, nauseas, dysuria and back pain with the results which showed: So I advised her to leave ER to protect her from long wait times during the pandemic and we would try to treat her as an outpatient with me checking in on her multiple times a day. Recent Results (from the past 2160 hour(s))  Urine Culture     Status: Abnormal   Collection Time: 08/16/19  7:12 AM   Specimen: Urine   UC  Result Value Ref Range   Urine Culture, Routine Final report (A)    Organism ID, Bacteria Escherichia coli (A)     Comment: Greater than 100,000 colony forming units per mL  Cefazolin <=4 ug/mL Cefazolin with an MIC <=16 predicts susceptibility to the oral agents cefaclor, cefdinir, cefpodoxime, cefprozil, cefuroxime, cephalexin, and loracarbef when used for therapy of uncomplicated urinary tract infections due to E. coli, Klebsiella pneumoniae, and Proteus mirabilis.    Antimicrobial Susceptibility Comment     Comment:       ** S = Susceptible; I = Intermediate; R = Resistant **                    P = Positive; N = Negative             MICS are expressed in micrograms per mL    Antibiotic                 RSLT#1    RSLT#2    RSLT#3    RSLT#4 Amoxicillin/Clavulanic Acid    S Ampicillin                     R Cefepime                       S Ceftriaxone                    S Cefuroxime                     S Ciprofloxacin  S Ertapenem                      S Gentamicin                     S Imipenem                       S Levofloxacin                   S Meropenem                      S Nitrofurantoin                 S Piperacillin/Tazobactam        S Tetracycline                   S Tobramycin                     S Trimethoprim/Sulfa             S   Urinalysis, Routine w reflex microscopic     Status: Abnormal   Collection Time: 08/16/19  7:12 AM  Result Value Ref Range   Specific Gravity, UA 1.016 1.005 - 1.030   pH, UA 5.5 5.0 - 7.5   Color, UA Yellow Yellow   Appearance Ur Turbid (A) Clear   Leukocytes,UA 3+ (A) Negative   Protein,UA 2+ (A) Negative/Trace   Glucose, UA Negative Negative   Ketones, UA Negative Negative   RBC, UA 2+ (A) Negative   Bilirubin, UA Negative Negative   Urobilinogen, Ur 1.0 0.2 - 1.0 mg/dL   Nitrite, UA Positive (A) Negative   Microscopic Examination See below:     Comment: Microscopic was indicated and was performed.  Microscopic Examination     Status: Abnormal   Collection Time: 08/16/19  7:12 AM  Result Value Ref Range   WBC, UA >30 (A) 0 - 5 /hpf    Comment: Clumps of leukocytes present.    RBC >30 (A) 0 - 2 /hpf   Epithelial Cells (non renal) 0-10 0 - 10 /hpf   Casts None seen None seen /lpf   Mucus, UA Present Not Estab.   Bacteria, UA Many (A) None seen/Few  Urinalysis, Complete w Microscopic     Status: Abnormal   Collection Time: 08/17/19  7:56 PM  Result Value Ref Range   Color, Urine AMBER (A) YELLOW    Comment: BIOCHEMICALS MAY BE AFFECTED BY COLOR   APPearance CLOUDY (A) CLEAR   Specific Gravity, Urine 1.010 1.005 - 1.030   pH 6.0 5.0 - 8.0   Glucose, UA NEGATIVE NEGATIVE mg/dL   Hgb urine dipstick SMALL (A) NEGATIVE   Bilirubin Urine NEGATIVE NEGATIVE   Ketones, ur NEGATIVE NEGATIVE mg/dL   Protein, ur 30 (A) NEGATIVE mg/dL   Nitrite POSITIVE (A) NEGATIVE   Leukocytes,Ua MODERATE (A) NEGATIVE   RBC / HPF 21-50 0 - 5 RBC/hpf   WBC, UA >50 (H) 0 - 5 WBC/hpf   Bacteria, UA RARE (A) NONE SEEN   Squamous Epithelial / LPF 0-5 0 - 5   WBC Clumps PRESENT    Mucus PRESENT     Comment: Performed at [facility], [address] [place], [place] [address]    I therefore placed her on Keflex 500 mg twice daily for 7 days along with  zofran for nausea,  and added cipro 500 mg twice daily x 5 days (both of which urine cx showed sensitive to) the next day when she stated her fever, her chills and body aches, and dysuria were  gone but she still didn't feel great.  Despite alleve, Tylenol, hydration, still a lot of lower back pain.    By Monday, she contacted me saying she felt a lot better- dysuria and nausea had not returned, intermittent low grade temp but her back was still quite tender, more than she expected.  Upon further questioning, despite my directions (which I also sent her result note outlining instructionst)- see below and the bottles stating that she was to take both antibiotics twice daily, she misunderstood and had been taking them both once daily.  She was staying hydrated and taking alleve and or Tylenol as I had advised.  She agreed to take her  evening doses of both tonight and follow up with me today. She took both keflex and cipro last night and this morning.   Also advised to go to ED if no improvement of symptoms.  Notes recorded by Lucille Passy, MD on 08/18/2019 at 1:23 PM EDT  Discussed urine cx results. Senstiive to keflex but she is showing chills and flank pain associated with pyleo so i'd like to double cover with cipro since cx showed sentiivity to cipro as well.  The patient indicates understanding of these issues and agrees with the plan.  Call or send my chart message prn if these symptoms worsen or fail to improve as anticipated.   ------   Notes recorded by Lucille Passy, MD on 08/17/2019 at 8:14 PM EDT  Discussed results with pt- eRx sent for Keflex( to cover potential pyelo) 500 mg twice daily, diflucan as she is prone to yeast infections and zofran as needed for nausea.  Call or send my chart message prn if these symptoms worsen or fail to improve as anticipated.  The patient indicates understanding of these issues and agrees with the plan.   No fever, no dysuria, still having dull flank pain.   She is unsure why she is having thoracic pain. Does have a h/o kidney stones.  Still feels "contractions" in her lower back.   Chills are gone.  Most symptoms improving but lower back pain- TTP is persistent.  Review of Systems  Constitutional: Negative for fever.  Gastrointestinal: Positive for nausea. Negative for vomiting.  Genitourinary: Positive for dysuria, flank pain, frequency and urgency. Negative for hematuria.  Musculoskeletal: Positive for back pain.  All other systems reviewed and are negative.    Patient Active Problem List   Diagnosis Date Noted  . History of kidney stones   . Pyelonephritis 08/19/2019  . Allergic rhinitis 04/08/2019  . GERD (gastroesophageal reflux disease) 10/22/2018  . Fatigue 10/22/2018  . Essential hypertension 05/12/2017  . Attention deficit hyperactivity disorder (ADHD),  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  . Anxiety and depression 07/29/2015  . Frequent headaches 07/29/2015  . HLD (hyperlipidemia) 07/29/2015    Social History   Tobacco Use  . Smoking status: Never Smoker  . Smokeless tobacco: Never Used  Substance Use Topics  . Alcohol use: Yes    Alcohol/week: 0.0 standard drinks    Comment: occasional - 1x/mo    Current Outpatient Medications:  .  aspirin-acetaminophen-caffeine (EXCEDRIN MIGRAINE) 250-250-65 MG tablet, Take by mouth every 6 (six) hours as needed for headache., Disp: , Rfl:  .  buPROPion (WELLBUTRIN XL) 300 MG  24 hr tablet, TAKE 1 TABLET BY MOUTH EVERY DAY, Disp: 90 tablet, Rfl: 1 .  cephALEXin (KEFLEX) 500 MG capsule, Take 1 capsule (500 mg total) by mouth 2 (two) times daily., Disp: 14 capsule, Rfl: 0 .  ciprofloxacin (CIPRO) 500 MG tablet, Take 1 tablet (500 mg total) by mouth 2 (two) times daily., Disp: 10 tablet, Rfl: 0 .  dexmethylphenidate (FOCALIN) 10 MG tablet, Take 1 tablet (10 mg total) by mouth 2 (two) times daily., Disp: 60 tablet, Rfl: 0 .  escitalopram (LEXAPRO) 20 MG tablet, TAKE 1 TABLET BY MOUTH EVERY DAY, Disp: 90 tablet, Rfl: 1 .  fluticasone (FLONASE) 50 MCG/ACT nasal spray, Place 2 sprays into both nostrils daily., Disp: 16 g, Rfl: PRN .  ibuprofen (ADVIL,MOTRIN) 200 MG tablet, Take 200 mg by mouth as needed., Disp: , Rfl:  .  lisinopril (PRINIVIL,ZESTRIL) 10 MG tablet, Take 1 tablet (10 mg total) by mouth daily., Disp: 90 tablet, Rfl: 3 .  ondansetron (ZOFRAN) 8 MG tablet, Take 1 tablet (8 mg total) by mouth every 8 (eight) hours as needed for nausea or vomiting., Disp: 20 tablet, Rfl: 0 .  pseudoephedrine (SUDAFED) 30 MG tablet, Take 30 mg by mouth as needed for congestion., Disp: , Rfl:   No Known Allergies  Objective:   VITALS: Per patient if applicable, see vitals. GENERAL: Alert, appears well and in no acute distress. HEENT: Atraumatic, conjunctiva clear, no obvious abnormalities on inspection of  external nose and ears. NECK: Normal movements of the head and neck. CARDIOPULMONARY: No increased WOB. Speaking in clear sentences. I:E ratio WNL.  MS: Moves all visible extremities without noticeable abnormality. PSYCH: Pleasant and cooperative, well-groomed. Speech normal rate and rhythm. Affect is appropriate. Insight and judgement are appropriate. Attention is focused, linear, and appropriate.  NEURO: CN grossly intact. Oriented as arrived to appointment on time with no prompting. Moves both UE equally.  SKIN: No obvious lesions, wounds, erythema, or cyanosis noted on face or hands.  Depression screen [facility] 2/9 09/11/2018 08/08/2018 10/27/2016  Decreased Interest 1 2 0  Down, Depressed, Hopeless 1 2 0  PHQ - 2 Score 2 4 0  Altered sleeping 0 3 -  Tired, decreased energy 1 3 -  Change in appetite 0 2 -  Feeling bad or failure about yourself  0 3 -  Trouble concentrating 1 2 -  Moving slowly or fidgety/restless 0 1 -  Suicidal thoughts 0 0 -  PHQ-9 Score 4 18 -  Difficult doing work/chores Not difficult at all Extremely dIfficult -    Assessment and Plan:   Jadynn was seen today for follow-up.  Diagnoses and all orders for this visit:  Pyelonephritis  History of kidney stones  Tubulo-interstitial nephritis -     CT RENAL STONE STUDY; Future    . COVID-19 Education: The signs and symptoms of COVID-19 were discussed with the patient and how to seek care for testing if needed. The importance of social distancing was discussed today. . Reviewed expectations re: course of current medical issues. . Discussed self-management of symptoms. . Outlined signs and symptoms indicating need for more acute intervention. . Patient verbalized understanding and all questions were answered. [name] Kitchen Health Maintenance issues including appropriate healthy diet, exercise, and smoking avoidance were discussed with patient. . See orders for this visit as documented in the electronic medical record.   Arnette Norris, MD  Records requested if needed. Time spent: 40 minutes, of which >50% was spent in obtaining information about her symptoms,  reviewing her previous labs, evaluations, and treatments, counseling her about her condition (please see the discussed topics above), and developing a plan to further investigate it; she had a number of questions which I addressed.

## 2019-08-20 ENCOUNTER — Other Ambulatory Visit: Payer: Self-pay

## 2019-08-20 ENCOUNTER — Ambulatory Visit (INDEPENDENT_AMBULATORY_CARE_PROVIDER_SITE_OTHER): Payer: Managed Care, Other (non HMO) | Admitting: Family Medicine

## 2019-08-20 DIAGNOSIS — N12 Tubulo-interstitial nephritis, not specified as acute or chronic: Secondary | ICD-10-CM | POA: Diagnosis not present

## 2019-08-20 DIAGNOSIS — Z87442 Personal history of urinary calculi: Secondary | ICD-10-CM | POA: Diagnosis not present

## 2019-08-20 NOTE — Addendum Note (Signed)
Addended by: Lucille Passy on: 08/20/2019 12:35 PM   Modules accepted: Orders

## 2019-08-21 ENCOUNTER — Ambulatory Visit: Admission: RE | Admit: 2019-08-21 | Payer: Managed Care, Other (non HMO) | Source: Ambulatory Visit

## 2019-09-06 ENCOUNTER — Telehealth: Payer: Self-pay

## 2019-09-06 NOTE — Telephone Encounter (Signed)
Copied from [place] [id number]. Topic: General - Other >> Sep 05, 2019  4:10 PM Rainey Pines A wrote: Patient would like to speak with Dr. Deborra Medina nurse in regards to kidney stones. Patient did not want to schedule appointment until she has spoken to nurse.

## 2019-09-10 ENCOUNTER — Telehealth: Payer: Self-pay | Admitting: Family Medicine

## 2019-09-10 ENCOUNTER — Other Ambulatory Visit: Payer: Self-pay | Admitting: Family Medicine

## 2019-09-10 MED ORDER — DEXMETHYLPHENIDATE HCL 10 MG PO TABS: 10.0000 mg | ORAL_TABLET | Freq: Two times a day (BID) | ORAL | 0 refills | Status: DC

## 2019-09-10 NOTE — Telephone Encounter (Signed)
eRx sent.  Thank you.

## 2019-09-10 NOTE — Telephone Encounter (Signed)
Pt called in she said that her pharmacy doesn't have medication dexmethylphenidate (FOCALIN) 10 MG tablet in stock.    [facility] [employer] [facility] [address], pt says that she is completely out of her medication.

## 2019-09-11 NOTE — Telephone Encounter (Signed)
I spoke with patient & she stated this was an old message. She thought that her kidney stone had passed & she was drinking lots of water. She will call back if needed. I apologized in delay in returning her call.

## 2019-10-22 ENCOUNTER — Other Ambulatory Visit: Payer: Self-pay

## 2019-10-22 MED ORDER — ESCITALOPRAM OXALATE 20 MG PO TABS: ORAL_TABLET | ORAL | 1 refills | Status: DC

## 2019-10-22 NOTE — Telephone Encounter (Signed)
Last ov 08/20/19 Last fill 04/30/19  #90/1

## 2019-11-04 ENCOUNTER — Other Ambulatory Visit: Payer: Self-pay

## 2019-11-04 MED ORDER — LISINOPRIL 10 MG PO TABS: 10.0000 mg | ORAL_TABLET | Freq: Every day | ORAL | 3 refills | Status: DC

## 2019-11-04 MED ORDER — BUPROPION HCL ER (XL) 300 MG PO TB24: 300.0000 mg | ORAL_TABLET | Freq: Every day | ORAL | 1 refills | Status: DC

## 2019-11-04 NOTE — Telephone Encounter (Signed)
Last ov 08/20/19 Last fill 06/06/19  #90/1 Last fill for Lisinopril 09/11/18  #90/3

## 2019-12-09 ENCOUNTER — Other Ambulatory Visit: Payer: Self-pay | Admitting: Family Medicine

## 2019-12-09 MED ORDER — DEXMETHYLPHENIDATE HCL 10 MG PO TABS: 10.0000 mg | ORAL_TABLET | Freq: Two times a day (BID) | ORAL | 0 refills | Status: DC

## 2020-01-10 ENCOUNTER — Other Ambulatory Visit: Payer: Self-pay | Admitting: Family Medicine

## 2020-01-10 NOTE — Telephone Encounter (Signed)
Last OV 08/20/19 Last fill 12/09/19  #60/0

## 2020-01-12 MED ORDER — DEXMETHYLPHENIDATE HCL 10 MG PO TABS: 10.0000 mg | ORAL_TABLET | Freq: Two times a day (BID) | ORAL | 0 refills | Status: DC

## 2020-01-22 ENCOUNTER — Other Ambulatory Visit: Payer: Managed Care, Other (non HMO)

## 2020-02-14 ENCOUNTER — Telehealth: Payer: Self-pay | Admitting: General Practice

## 2020-02-14 MED ORDER — DEXMETHYLPHENIDATE HCL 10 MG PO TABS: 10.0000 mg | ORAL_TABLET | Freq: Two times a day (BID) | ORAL | 0 refills | Status: DC

## 2020-02-14 NOTE — Telephone Encounter (Signed)
Patient is calling and requesting a [facility]ill [facility] [address] Pt is going to [facility] to the [facility] location. CB is [phone]

## 2020-03-06 ENCOUNTER — Ambulatory Visit (INDEPENDENT_AMBULATORY_CARE_PROVIDER_SITE_OTHER): Payer: Managed Care, Other (non HMO) | Admitting: Family Medicine

## 2020-03-06 ENCOUNTER — Other Ambulatory Visit: Payer: Self-pay

## 2020-03-06 ENCOUNTER — Encounter: Payer: Self-pay | Admitting: Family Medicine

## 2020-03-06 VITALS — BP 114/62 | HR 83 | Temp 97.4°F | Ht 65.0 in | Wt 185.8 lb

## 2020-03-06 DIAGNOSIS — N920 Excessive and frequent menstruation with regular cycle: Secondary | ICD-10-CM | POA: Insufficient documentation

## 2020-03-06 DIAGNOSIS — E559 Vitamin D deficiency, unspecified: Secondary | ICD-10-CM | POA: Diagnosis not present

## 2020-03-06 DIAGNOSIS — E785 Hyperlipidemia, unspecified: Secondary | ICD-10-CM

## 2020-03-06 DIAGNOSIS — R5383 Other fatigue: Secondary | ICD-10-CM | POA: Diagnosis not present

## 2020-03-06 DIAGNOSIS — F900 Attention-deficit hyperactivity disorder, predominantly inattentive type: Secondary | ICD-10-CM

## 2020-03-06 MED ORDER — DEXMETHYLPHENIDATE HCL 10 MG PO TABS: 10.0000 mg | ORAL_TABLET | Freq: Two times a day (BID) | ORAL | 0 refills | Status: DC

## 2020-03-06 NOTE — Patient Instructions (Addendum)
Please call and schedule an appointment for screening mammogram. A referral is not needed.  Teresa Villarreal [id number]  Please schedule a follow up complete physical/ pap in 3 months

## 2020-03-06 NOTE — Progress Notes (Signed)
Subjective:    Patient ID: Teresa Villarreal, female    DOB: 1971/07/16, 49 y.o.   MRN: [id number]  HPI Chief Complaint  Patient presents with  . New Patient (Initial Visit)  . Medication Refill    Focalin   This is a 49 yo female who presents today to establish care. Prior patient Dr. Deborra Medina. She is a [employer]. Has returned to work in the last year after staying home with her kids. It has been a difficult transition with pandemic. Parents at [facility], mom with alzheimers, dad with dementia. Three kids 08/23/14 (girl, girl, boy). Enjoys [employer].   Last CPE- unknown  Mammo- 2018 Pap- 11/02/2016 Colonoscopy- never Tdap-10/11/2011 Flu-some years Eye- a couple of years ago Exercise- not regular  ADHD- testing 2017, currently on Focalin 10 mg bid, takes first dose 7 am, second dose 2:30 pm. Some decreased effectiveness middle of the day.  Feels like emotions are flat. No sex drive. Not sure if this is related to meds or life stressors. She was walking most days in the fall and felt better.   Review of Systems No chest pain, SOB, palpitations, insomnia, abdominal pain, constipation, diarrhea, muscle or joint pain.     Objective:   Physical Exam Constitutional:      General: She is not in acute distress.    Appearance: Normal appearance. She is obese. She is not ill-appearing, toxic-appearing or diaphoretic.  HENT:     Head: Normocephalic and atraumatic.  Cardiovascular:     Rate and Rhythm: Normal rate.  Pulmonary:     Effort: Pulmonary effort is normal.  Neurological:     Mental Status: She is alert and oriented to person, place, and time.  Psychiatric:        Mood and Affect: Mood normal.        Behavior: Behavior normal.        Thought Content: Thought content normal.        Judgment: Judgment normal.       BP 114/62   Pulse 83   Temp (!) 97.4 F (36.3 C)   Ht 5\' 5"  (1.651 m)   Wt 185 lb 12.8 oz (84.3 kg)   SpO2 96%   BMI 30.92 kg/m  Wt Readings from  Last 3 Encounters:  03/06/20 185 lb 12.8 oz (84.3 kg)  08/17/19 175 lb (79.4 kg)  11/22/18 183 lb (83 kg)       Assessment & Plan:  1. Hyperlipidemia, unspecified hyperlipidemia type - Lipid Panel - Hemoglobin A1c  2. Other fatigue - check labs, encouraged self care, adequate sleep, regular time outside, regular walking - TSH - CBC with Differential - Vitamin B12 - Comprehensive metabolic panel - Ferritin  3. Vitamin D deficiency - Vitamin D, 25-hydroxy  4. Attention deficit hyperactivity disorder (ADHD), predominantly inattentive type - doing well on current medications, reviewed available psycho-educational testing found in Media tab.  - Pain Mgmt, Profile 8 w/Conf, U - dexmethylphenidate (FOCALIN) 10 MG tablet; Take 1 tablet (10 mg total) by mouth 2 (two) times daily.  Dispense: 60 tablet; Refill: 0 - dexmethylphenidate (FOCALIN) 10 MG tablet; Take 1 tablet (10 mg total) by mouth 2 (two) times daily.  Dispense: 60 tablet; Refill: 0 - dexmethylphenidate (FOCALIN) 10 MG tablet; Take 1 tablet (10 mg total) by mouth 2 (two) times daily.  Dispense: 60 tablet; Refill: 0  - follow up in 3 months for CPE, provided her information to schedule mammogram  This visit occurred during  the SARS-CoV-2 public health emergency.  Safety protocols were in place, including screening questions prior to the visit, additional usage of staff PPE, and extensive cleaning of exam room while observing appropriate contact time as indicated for disinfecting solutions.    Clarene Reamer, FNP-BC  [facility]  03/07/2020 5:53 PM

## 2020-03-07 ENCOUNTER — Encounter: Payer: Self-pay | Admitting: Family Medicine

## 2020-03-08 LAB — LIPID PANEL
Cholesterol: 243 mg/dL — ABNORMAL HIGH (ref <200)
HDL: 48 mg/dL — ABNORMAL LOW (ref >=50)
LDL Cholesterol (Calc): 153 mg/dL (calc) — ABNORMAL HIGH
Non-HDL Cholesterol (Calc): 195 mg/dL (calc) — ABNORMAL HIGH (ref <130)
Total CHOL/HDL Ratio: 5.1 (calc) — ABNORMAL HIGH (ref <5.0)
Triglycerides: 247 mg/dL — ABNORMAL HIGH (ref <150)

## 2020-03-08 LAB — COMPREHENSIVE METABOLIC PANEL
AG Ratio: 1.9 (calc) (ref 1.0–2.5)
ALT: 12 U/L (ref 6–29)
AST: 14 U/L (ref 10–35)
Albumin: 4.3 g/dL (ref 3.6–5.1)
Alkaline phosphatase (APISO): 87 U/L (ref 31–125)
BUN: 18 mg/dL (ref 7–25)
CO2: 28 mmol/L (ref 20–32)
Calcium: 9.8 mg/dL (ref 8.6–10.2)
Chloride: 103 mmol/L (ref 98–110)
Creat: 1 mg/dL (ref 0.50–1.10)
Globulin: 2.3 g/dL (calc) (ref 1.9–3.7)
Glucose, Bld: 98 mg/dL (ref 65–99)
Potassium: 4.8 mmol/L (ref 3.5–5.3)
Sodium: 141 mmol/L (ref 135–146)
Total Bilirubin: 0.3 mg/dL (ref 0.2–1.2)
Total Protein: 6.6 g/dL (ref 6.1–8.1)

## 2020-03-08 LAB — CBC WITH DIFFERENTIAL/PLATELET
Absolute Monocytes: 594 cells/uL (ref 200–950)
Basophils Absolute: 40 cells/uL (ref 0–200)
Basophils Relative: 0.6 %
Eosinophils Absolute: 211 cells/uL (ref 15–500)
Eosinophils Relative: 3.2 %
HCT: 37.3 % (ref 35.0–45.0)
Hemoglobin: 12.3 g/dL (ref 11.7–15.5)
Lymphs Abs: 2013 cells/uL (ref 850–3900)
MCH: 30.8 pg (ref 27.0–33.0)
MCHC: 33 g/dL (ref 32.0–36.0)
MCV: 93.5 fL (ref 80.0–100.0)
MPV: 10.3 fL (ref 7.5–12.5)
Monocytes Relative: 9 %
Neutro Abs: 3742 cells/uL (ref 1500–7800)
Neutrophils Relative %: 56.7 %
Platelets: 283 10*3/uL (ref 140–400)
RBC: 3.99 10*6/uL (ref 3.80–5.10)
RDW: 12 % (ref 11.0–15.0)
Total Lymphocyte: 30.5 %
WBC: 6.6 10*3/uL (ref 3.8–10.8)

## 2020-03-08 LAB — PAIN MGMT, PROFILE 8 W/CONF, U
6 Acetylmorphine: NEGATIVE ng/mL
Alcohol Metabolites: NEGATIVE ng/mL (ref <500)
Amphetamines: NEGATIVE ng/mL
Benzodiazepines: NEGATIVE ng/mL
Buprenorphine, Urine: NEGATIVE ng/mL
Cocaine Metabolite: NEGATIVE ng/mL
Creatinine: 72.8 mg/dL
MDMA: NEGATIVE ng/mL
Marijuana Metabolite: NEGATIVE ng/mL
Opiates: NEGATIVE ng/mL
Oxidant: NEGATIVE ug/mL
Oxycodone: NEGATIVE ng/mL
pH: 5.6 (ref 4.5–9.0)

## 2020-03-08 LAB — HEMOGLOBIN A1C
Hgb A1c MFr Bld: 5 % of total Hgb (ref <5.7)
Mean Plasma Glucose: 97 (calc)
eAG (mmol/L): 5.4 (calc)

## 2020-03-08 LAB — VITAMIN B12: Vitamin B-12: 549 pg/mL (ref 200–1100)

## 2020-03-08 LAB — TSH: TSH: 0.99 mIU/L

## 2020-03-08 LAB — FERRITIN: Ferritin: 13 ng/mL — ABNORMAL LOW (ref 16–232)

## 2020-03-08 LAB — VITAMIN D 25 HYDROXY (VIT D DEFICIENCY, FRACTURES): Vit D, 25-Hydroxy: 16 ng/mL — ABNORMAL LOW (ref 30–100)

## 2020-04-16 ENCOUNTER — Other Ambulatory Visit: Payer: Self-pay

## 2020-04-16 MED ORDER — BUPROPION HCL ER (XL) 300 MG PO TB24: 300.0000 mg | ORAL_TABLET | Freq: Every day | ORAL | 2 refills | Status: DC

## 2020-04-27 ENCOUNTER — Other Ambulatory Visit: Payer: Self-pay

## 2020-04-27 DIAGNOSIS — F900 Attention-deficit hyperactivity disorder, predominantly inattentive type: Secondary | ICD-10-CM

## 2020-04-27 MED ORDER — ESCITALOPRAM OXALATE 20 MG PO TABS: ORAL_TABLET | ORAL | 1 refills | Status: DC

## 2020-04-27 NOTE — Telephone Encounter (Signed)
Patient contacted the office and states she needs a refill on Lexapro and Focalin. Lexapro was last refilled 10/22/19 for #90 with 1 refill.  Focalin was last refilled 03/06/20 and there is 3 different prescriptions for a 3 month supply.  Patient was last seen on 03/06/20 and has an appt on 05/20/20 Patient would like these sent to [facility] on [address]

## 2020-05-13 ENCOUNTER — Telehealth: Payer: Self-pay

## 2020-05-13 NOTE — Telephone Encounter (Signed)
Noted  

## 2020-05-13 NOTE — Telephone Encounter (Signed)
Pt said has had a rather large knot under chin, ? Swollen lymph gland; pt said whole rt side feels tired; pt wonders if has sinus infection.

## 2020-05-13 NOTE — Telephone Encounter (Signed)
For 1 wk pt has rather lge lump under rt side of chin; pt questions if lymph node; pt entire rt side of body feels tired. H/A on rt side of head,rt arm feels tired; pt is using rt arm normally except rt arm does not feel as strong as usual but pt said not like a stroke. Pt having no problem walking. Rt shoulder is sore but no CP,SOB, or dizziness and no fever. Pt wonders if could be sinus infection due to rt cheek has tenderness and when swallows no S/T but throat feels uncomfortable and that feeling goes up into rt ear. Pt got both Moderna covid vaccines at [facility].; pt is driving and does not know date of first vaccine but had 2nd vaccine on 03/21/20. Pt could not be seen on 05/14/20 due to last day of school. Pt has in office appt with Glenda Chroman FNP on 05/15/20 at 3 PM Oked per Glenda Chroman FNP. UC & ED precautions given and pt voiced understanding.

## 2020-05-14 ENCOUNTER — Encounter: Payer: Self-pay | Admitting: Emergency Medicine

## 2020-05-14 ENCOUNTER — Ambulatory Visit
Admission: EM | Admit: 2020-05-14 | Discharge: 2020-05-14 | Disposition: A | Discharge status: Home / Self Care | Payer: Managed Care, Other (non HMO) | Attending: Emergency Medicine | Admitting: Emergency Medicine

## 2020-05-14 ENCOUNTER — Other Ambulatory Visit: Payer: Self-pay

## 2020-05-14 DIAGNOSIS — J0100 Acute maxillary sinusitis, unspecified: Secondary | ICD-10-CM | POA: Diagnosis not present

## 2020-05-14 MED ORDER — AZITHROMYCIN 250 MG PO TABS: 250.0000 mg | ORAL_TABLET | Freq: Every day | ORAL | 0 refills | Status: DC

## 2020-05-14 NOTE — ED Triage Notes (Signed)
Pt c/o nasal congestion, sinus pain and pressure and headache. Started about a month ago. She started having swollen lymph nodes about a week ago. Denies fever or cough. Pt states she has had covid vaccines.

## 2020-05-14 NOTE — ED Provider Notes (Signed)
[name]    CSN: [id number] Arrival date & time: 05/14/20  1621      History   Chief Complaint Chief Complaint  Patient presents with  . Nasal Congestion    HPI Teresa Villarreal is a 49 y.o. female.   Patient presents with right side sinus pressure, facial pain, nasal congestion, sinus headache x1 month.  She also reports swollen tender cervical lymph node on right side x1 week.  She also reports fatigue.  She denies fever, chills, rash, sore throat, cough, shortness of breath, vomiting, diarrhea, or other symptoms.  Treatment attempted at home with ibuprofen and allergy medication.  The history is provided by the patient.    Past Medical History:  Diagnosis Date  . Chicken pox   . Depression   . Frequent headaches    stress, approx 2x/wk  . Genital warts   . History of kidney stones   . Hyperlipidemia   . Hypertension   . Motion sickness    amusement park rides  . PONV (postoperative nausea and vomiting)     Patient Active Problem List   Diagnosis Date Noted  . Menorrhagia 03/06/2020  . History of kidney stones   . Pyelonephritis 08/19/2019  . Allergic rhinitis 04/08/2019  . GERD (gastroesophageal reflux disease) 10/22/2018  . Fatigue 10/22/2018  . Essential hypertension 05/12/2017  . 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 Anxiety and depression 07/29/2015  . Frequent headaches 07/29/2015  . HLD (hyperlipidemia) 07/29/2015  . Knee pain 04/22/2013  . Overweight 04/22/2013  . Ganglion of right wrist 06/01/2012  . Vitamin D deficiency 03/18/2012    Past Surgical History:  Procedure Laterality Date  . CYSTOSCOPY  2010  . ESOPHAGOGASTRODUODENOSCOPY (EGD) WITH PROPOFOL N/A 11/22/2018   Procedure: ESOPHAGOGASTRODUODENOSCOPY (EGD) WITH PROPOFOL;  Surgeon: Lucilla Lame, MD;  Location: [facility];  Service: Endoscopy;  Laterality: N/A;  . HYSTEROSCOPY  2010  . KNEE ARTHROSCOPY Left 1990     OB History   No obstetric history on file.      Home Medications    Prior to Admission medications   Medication Sig Start Date End Date Taking? Authorizing Provider  buPROPion (WELLBUTRIN XL) 300 MG 24 hr tablet Take 1 tablet (300 mg total) by mouth daily. 04/16/20  Yes Elby Beck, FNP  dexmethylphenidate (FOCALIN) 10 MG tablet Take 1 tablet (10 mg total) by mouth 2 (two) times daily. 03/06/20  Yes Elby Beck, FNP  dexmethylphenidate (FOCALIN) 10 MG tablet Take 1 tablet (10 mg total) by mouth 2 (two) times daily. 03/06/20  Yes Elby Beck, FNP  dexmethylphenidate (FOCALIN) 10 MG tablet Take 1 tablet (10 mg total) by mouth 2 (two) times daily. 03/06/20  Yes Elby Beck, FNP  escitalopram (LEXAPRO) 20 MG tablet TAKE 1 TABLET BY MOUTH EVERY DAY 04/27/20  Yes Elby Beck, FNP  ibuprofen (ADVIL,MOTRIN) 200 MG tablet Take 200 mg by mouth as needed.   Yes [provider]  lisinopril (ZESTRIL) 10 MG tablet Take 1 tablet (10 mg total) by mouth daily. 11/04/19  Yes Lucille Passy, MD  pseudoephedrine (SUDAFED) 30 MG tablet Take 30 mg by mouth as needed for congestion.   Yes [provider]  aspirin-acetaminophen-caffeine (EXCEDRIN MIGRAINE) [phone] MG tablet Take by mouth every 6 (six) hours as needed for headache.    [provider]  azithromycin (ZITHROMAX) 250 MG tablet Take 1 tablet (250 mg total) by mouth daily. Take first 2 tablets  together, then 1 every day until finished. 05/14/20   Sharion Balloon, NP  fluticasone (FLONASE) 50 MCG/ACT nasal spray Place 2 sprays into both nostrils daily. Patient not taking: Reported on 03/06/2020 03/01/18   Lucille Passy, MD    Family History Family History  Problem Relation Age of Onset  . Hyperlipidemia Mother   . Heart disease Mother   . Hypertension Mother   . Alzheimer's disease Mother   . COPD Mother   . Hyperlipidemia Father   . Hypertension Father   . Mitochondrial disorder Brother         Mitochondrial encephalomyopathy lactic acidosis  . Breast cancer Paternal Aunt   . Arthritis Maternal Grandmother     Social History Social History   Tobacco Use  . Smoking status: Never Smoker  . Smokeless tobacco: Never Used  Substance Use Topics  . Alcohol use: Yes    Alcohol/week: 0.0 standard drinks    Comment: occasional - 1x/mo  . Drug use: No     Allergies   Patient has no known allergies.   Review of Systems Review of Systems  Constitutional: Positive for fatigue. Negative for chills and fever.  HENT: Positive for congestion, postnasal drip, rhinorrhea, sinus pressure and sinus pain. Negative for ear pain and sore throat.   Eyes: Negative for pain and visual disturbance.  Respiratory: Negative for cough and shortness of breath.   Cardiovascular: Negative for chest pain and palpitations.  Gastrointestinal: Negative for abdominal pain, diarrhea, nausea and vomiting.  Genitourinary: Negative for dysuria and hematuria.  Musculoskeletal: Negative for arthralgias and back pain.  Skin: Negative for color change and rash.  Neurological: Negative for seizures and syncope.  All other systems reviewed and are negative.    Physical Exam Triage Vital Signs ED Triage Vitals  Enc Vitals Group     BP      Pulse      Resp      Temp      Temp src      SpO2      Weight      Height      Head Circumference      Peak Flow      Pain Score      Pain Loc      Pain Edu?      Excl. in [place]?    No data found.  Updated Vital Signs BP 116/78 (BP Location: Left Arm)   Pulse 82   Temp 98.8 F (37.1 C) (Oral)   Resp 18   Ht 5\' 5"  (1.651 m)   Wt 185 lb 13.6 oz (84.3 kg)   LMP 02/11/2020   SpO2 96%   BMI 30.93 kg/m   Visual Acuity Right Eye Distance:   Left Eye Distance:   Bilateral Distance:    Right Eye Near:   Left Eye Near:    Bilateral Near:     Physical Exam Vitals and nursing note reviewed.  Constitutional:      General: She is not in acute  distress.    Appearance: She is well-developed. She is not ill-appearing.  HENT:     Head: Normocephalic and atraumatic.     Right Ear: Tympanic membrane normal.     Left Ear: Tympanic membrane normal.     Nose: Congestion present.     Mouth/Throat:     Mouth: Mucous membranes are moist.     Pharynx: Oropharynx is clear.  Eyes:     Conjunctiva/sclera: Conjunctivae normal.  Cardiovascular:     Rate and Rhythm: Normal rate and regular rhythm.     Heart sounds: No murmur.  Pulmonary:     Effort: Pulmonary effort is normal. No respiratory distress.     Breath sounds: Normal breath sounds.  Abdominal:     Palpations: Abdomen is soft.     Tenderness: There is no abdominal tenderness. There is no guarding or rebound.  Musculoskeletal:     Cervical back: Neck supple.  Lymphadenopathy:     Cervical: Cervical adenopathy present.  Skin:    General: Skin is warm and dry.     Findings: No rash.  Neurological:     General: No focal deficit present.     Mental Status: She is alert and oriented to person, place, and time.     Gait: Gait normal.  Psychiatric:        Mood and Affect: Mood normal.        Behavior: Behavior normal.      UC Treatments / Results  Labs (all labs ordered are listed, but only abnormal results are displayed) Labs Reviewed - No data to display  EKG   Radiology No results found.  Procedures Procedures (including critical care time)  Medications Ordered in UC Medications - No data to display  Initial Impression / Assessment and Plan / UC Course  I have reviewed the triage vital signs and the nursing notes.  Pertinent labs & imaging results that were available during my care of the patient were reviewed by me and considered in my medical decision making (see chart for details).   Acute sinusitis.  Treating with Zithromax, Mucinex, and ibuprofen.  Instructed patient to follow-up with her PCP if her symptoms are not improving.  Patient agrees to plan of  care.   Final Clinical Impressions(s) / UC Diagnoses   Final diagnoses:  Acute non-recurrent maxillary sinusitis     Discharge Instructions     Take the Zithromax as directed.  Also take ibuprofen and plain Mucinex for your symptoms.    Follow up with your primary care provider if your symptoms are not improving.       ED Prescriptions    Medication Sig Dispense Auth. Provider   azithromycin (ZITHROMAX) 250 MG tablet Take 1 tablet (250 mg total) by mouth daily. Take first 2 tablets together, then 1 every day until finished. 6 tablet Sharion Balloon, NP     PDMP not reviewed this encounter.   Sharion Balloon, NP 05/14/20 [phone]

## 2020-05-14 NOTE — Discharge Instructions (Signed)
Take the Zithromax as directed.  Also take ibuprofen and plain Mucinex for your symptoms.    Follow up with your primary care provider if your symptoms are not improving.

## 2020-05-15 ENCOUNTER — Ambulatory Visit: Payer: Managed Care, Other (non HMO) | Admitting: Family Medicine

## 2020-05-20 ENCOUNTER — Other Ambulatory Visit: Payer: Self-pay

## 2020-05-20 ENCOUNTER — Encounter: Payer: Self-pay | Admitting: Family Medicine

## 2020-05-20 ENCOUNTER — Other Ambulatory Visit (HOSPITAL_COMMUNITY)
Admission: RE | Admit: 2020-05-20 | Discharge: 2020-05-20 | Disposition: A | Discharge status: Home / Self Care | Payer: Managed Care, Other (non HMO) | Source: Ambulatory Visit | Attending: Family Medicine | Admitting: Family Medicine

## 2020-05-20 ENCOUNTER — Ambulatory Visit (INDEPENDENT_AMBULATORY_CARE_PROVIDER_SITE_OTHER): Payer: Managed Care, Other (non HMO) | Admitting: Family Medicine

## 2020-05-20 VITALS — BP 120/68 | HR 76 | Temp 98.2°F | Ht 65.0 in | Wt 185.8 lb

## 2020-05-20 DIAGNOSIS — F900 Attention-deficit hyperactivity disorder, predominantly inattentive type: Secondary | ICD-10-CM | POA: Diagnosis not present

## 2020-05-20 DIAGNOSIS — E559 Vitamin D deficiency, unspecified: Secondary | ICD-10-CM

## 2020-05-20 DIAGNOSIS — R790 Abnormal level of blood mineral: Secondary | ICD-10-CM

## 2020-05-20 DIAGNOSIS — Z124 Encounter for screening for malignant neoplasm of cervix: Secondary | ICD-10-CM

## 2020-05-20 DIAGNOSIS — Z0000 Encounter for general adult medical examination without abnormal findings: Secondary | ICD-10-CM | POA: Diagnosis not present

## 2020-05-20 DIAGNOSIS — J302 Other seasonal allergic rhinitis: Secondary | ICD-10-CM

## 2020-05-20 MED ORDER — DEXMETHYLPHENIDATE HCL 10 MG PO TABS: 10.0000 mg | ORAL_TABLET | Freq: Two times a day (BID) | ORAL | 0 refills | Status: DC

## 2020-05-20 MED ORDER — FLUTICASONE PROPIONATE 50 MCG/ACT NA SUSP: 2.0000 | Freq: Every day | NASAL | 99 refills | Status: DC

## 2020-05-20 NOTE — Patient Instructions (Signed)
Please call and schedule an appointment for screening mammogram. A referral is not needed.  [place]  Start your iron and vitamin D  Follow up in 6 months  Start flonase, 2 sprays in each nostril twice a day for 3 days then every night Health Maintenance, Female Adopting a healthy lifestyle and getting preventive care are important in promoting health and wellness. Ask your health care provider about:  The right schedule for you to have regular tests and exams.  Things you can do on your own to prevent diseases and keep yourself healthy. What should I know about diet, weight, and exercise? Eat a healthy diet   Eat a diet that includes plenty of vegetables, fruits, low-fat dairy products, and lean protein.  Do not eat a lot of foods that are high in solid fats, added sugars, or sodium. Maintain a healthy weight Body mass index (BMI) is used to identify weight problems. It estimates body fat based on height and weight. Your health care provider can help determine your BMI and help you achieve or maintain a healthy weight. Get regular exercise Get regular exercise. This is one of the most important things you can do for your health. Most adults should:  Exercise for at least 150 minutes each week. The exercise should increase your heart rate and make you sweat (moderate-intensity exercise).  Do strengthening exercises at least twice a week. This is in addition to the moderate-intensity exercise.  Spend less time sitting. Even light physical activity can be beneficial. Watch cholesterol and blood lipids Have your blood tested for lipids and cholesterol at 50 years of age, then have this test every 5 years. Have your cholesterol levels checked more often if:  Your lipid or cholesterol levels are high.  You are older than 50 years of age.  You are at high risk for heart disease. What should I know about cancer screening? Depending on your  health history and family history, you may need to have cancer screening at various ages. This may include screening for:  Breast cancer.  Cervical cancer.  Colorectal cancer.  Skin cancer.  Lung cancer. What should I know about heart disease, diabetes, and high blood pressure? Blood pressure and heart disease  High blood pressure causes heart disease and increases the risk of stroke. This is more likely to develop in people who have high blood pressure readings, are of African descent, or are overweight.  Have your blood pressure checked: ? Every 3-5 years if you are 16-44 years of age. ? Every year if you are 35 years old or older. Diabetes Have regular diabetes screenings. This checks your fasting blood sugar level. Have the screening done:  Once every three years after age 14 if you are at a normal weight and have a low risk for diabetes.  More often and at a younger age if you are overweight or have a high risk for diabetes. What should I know about preventing infection? Hepatitis B If you have a higher risk for hepatitis B, you should be screened for this virus. Talk with your health care provider to find out if you are at risk for hepatitis B infection. Hepatitis C Testing is recommended for:  Everyone born from [birth year] through 1966.  Anyone with known risk factors for hepatitis C. Sexually transmitted infections (STIs)  Get screened for STIs, including gonorrhea and chlamydia, if: ? You are sexually active and are younger than 50 years of age. ? You are  older than 50 years of age and your health care provider tells you that you are at risk for this type of infection. ? Your sexual activity has changed since you were last screened, and you are at increased risk for chlamydia or gonorrhea. Ask your health care provider if you are at risk.  Ask your health care provider about whether you are at high risk for HIV. Your health care provider may recommend a prescription  medicine to help prevent HIV infection. If you choose to take medicine to prevent HIV, you should first get tested for HIV. You should then be tested every 3 months for as long as you are taking the medicine. Pregnancy  If you are about to stop having your period (premenopausal) and you may become pregnant, seek counseling before you get pregnant.  Take 400 to 800 micrograms (mcg) of folic acid every day if you become pregnant.  Ask for birth control (contraception) if you want to prevent pregnancy. Osteoporosis and menopause Osteoporosis is a disease in which the bones lose minerals and strength with aging. This can result in bone fractures. If you are 65 years old or older, or if you are at risk for osteoporosis and fractures, ask your health care provider if you should:  Be screened for bone loss.  Take a calcium or vitamin D supplement to lower your risk of fractures.  Be given hormone replacement therapy (HRT) to treat symptoms of menopause. Follow these instructions at home: Lifestyle  Do not use any products that contain nicotine or tobacco, such as cigarettes, e-cigarettes, and chewing tobacco. If you need help quitting, ask your health care provider.  Do not use street drugs.  Do not share needles.  Ask your health care provider for help if you need support or information about quitting drugs. Alcohol use  Do not drink alcohol if: ? Your health care provider tells you not to drink. ? You are pregnant, may be pregnant, or are planning to become pregnant.  If you drink alcohol: ? Limit how much you use to 0-1 drink a day. ? Limit intake if you are breastfeeding.  Be aware of how much alcohol is in your drink. In the [place] one drink equals one 12 oz bottle of beer (355 mL), one 5 oz glass of wine (148 mL), or one 1 oz glass of hard liquor (44 mL). General instructions  Schedule regular health, dental, and eye exams.  Stay current with your vaccines.  Tell your health  care provider if: ? You often feel depressed. ? You have ever been abused or do not feel safe at home. Summary  Adopting a healthy lifestyle and getting preventive care are important in promoting health and wellness.  Follow your health care provider's instructions about healthy diet, exercising, and getting tested or screened for diseases.  Follow your health care provider's instructions on monitoring your cholesterol and blood pressure. This information is not intended to replace advice given to you by your health care provider. Make sure you discuss any questions you have with your health care provider. Document Revised: 11/21/2018 Document Reviewed: 11/21/2018 Elsevier Patient Education  2020 [employer].

## 2020-05-20 NOTE — Progress Notes (Signed)
Subjective:    Patient ID: Teresa Villarreal, female    DOB: [birth year], 49 y.o.   MRN: [id number]  HPI Chief Complaint  Patient presents with  . Annual Exam    Seen by UC x 1 week ago for sinus infection. Still has sinus pressure and ear pressure. Pt never ran a fever. Completed abx x 2 days ago. Pap today.      Last CPE- several years ago Mammo- 2018 Pap-11/02/2016 Tdap- 10/11/2011 Eye- annual Dental- regular Exercise- not regular  Was seen at urgent care recently for sinusitis. Completed azithromycin. Continues to have some ear pressure. Facial pressure improved.   Perimenopausal- last menstrual period 1/21. No hot flashes currently.    Review of Systems  Constitutional: Negative.   HENT: Positive for ear pain and postnasal drip.   Eyes: Negative.   Respiratory: Negative.   Cardiovascular: Negative.   Gastrointestinal: Positive for diarrhea and nausea (with recent antibiotic use).  Endocrine: Negative.   Genitourinary: Negative.   Musculoskeletal: Negative.   Skin: Negative.   Allergic/Immunologic: Positive for environmental allergies (spring/ fall).  Neurological: Negative.   Hematological: Negative.   Psychiatric/Behavioral: Negative.        Objective:   Physical Exam Physical Exam  Constitutional: She is oriented to person, place, and time. She appears well-developed and well-nourished. No distress.  HENT:  Head: Normocephalic and atraumatic.  Right Ear: External ear normal.  Left Ear: External ear normal.  Nose: Nose normal.  Mouth/Throat: Oropharynx is clear and moist. No oropharyngeal exudate.  Eyes: Conjunctivae are normal. Pupils are equal, round, and reactive to light.  Neck: Normal range of motion. Neck supple. No JVD present. No thyromegaly present.  Cardiovascular: Normal rate, regular rhythm, normal heart sounds and intact distal pulses.   Pulmonary/Chest: Effort normal and breath sounds normal. Right breast exhibits no inverted nipple, no mass,  no nipple discharge, no skin change and no tenderness. Left breast exhibits no inverted nipple, no mass, no nipple discharge, no skin change and no tenderness. Breasts are symmetrical.  Abdominal: Soft. Bowel sounds are normal. She exhibits no distension and no mass. There is no tenderness. There is no rebound and no guarding.  Genitourinary: Vagina normal. Pelvic exam was performed with patient supine. There is no rash, tenderness, lesion or injury on the right labia. There is no rash, tenderness, lesion or injury on the left labia. Cervix exhibits no motion tenderness and no discharge. No vaginal discharge found.  Musculoskeletal: Normal range of motion. She exhibits no edema or tenderness.  Lymphadenopathy:    She has no cervical adenopathy.  Neurological: She is alert and oriented to person, place, and time. She has normal reflexes.  Skin: Skin is warm and dry. She is not diaphoretic.  Psychiatric: She has a normal mood and affect. Her behavior is normal. Judgment and thought content normal.  Vitals reviewed.     BP 120/68 (BP Location: Left Arm, Patient Position: Sitting, Cuff Size: Normal)   Pulse 76   Temp 98.2 F (36.8 C) (Temporal)   Ht 5\' 5"  (1.651 m)   Wt 185 lb 12.8 oz (84.3 kg)   SpO2 98%   BMI 30.92 kg/m  Wt Readings from Last 3 Encounters:  05/20/20 185 lb 12.8 oz (84.3 kg)  05/14/20 185 lb 13.6 oz (84.3 kg)  03/06/20 185 lb 12.8 oz (84.3 kg)   Depression screen [facility] 2/9 05/20/2020 09/11/2018 08/08/2018 10/27/2016 07/29/2015  Decreased Interest 0 1 2 0 0  Down, Depressed, Hopeless 0 1 2  0 3  PHQ - 2 Score 0 2 4 0 3  Altered sleeping - 0 3 - 3  Tired, decreased energy - 1 3 - 3  Change in appetite - 0 2 - 3  Feeling bad or failure about yourself  - 0 3 - 3  Trouble concentrating - 1 2 - 0  Moving slowly or fidgety/restless - 0 1 - 0  Suicidal thoughts - 0 0 - 0  PHQ-9 Score - 4 18 - 15  Difficult doing work/chores - Not difficult at all Extremely dIfficult - -        Assessment & Plan:  1. Annual physical exam - Discussed and encouraged healthy lifestyle choices- adequate sleep, regular exercise, stress management and healthy food choices.  - provided information to schedule her mammogram  2. Screening for cervical cancer - Cytology - PAP([facility])  3. Seasonal allergic rhinitis, unspecified trigger - fluticasone nasal spray  4. Attention deficit hyperactivity disorder (ADHD), predominantly inattentive type - takes year round, good control of symptoms - she will notify me for refill in 3 months - follow up in 6 months - dexmethylphenidate (FOCALIN) 10 MG tablet; Take 1 tablet (10 mg total) by mouth 2 (two) times daily.  Dispense: 60 tablet; Refill: 0 - dexmethylphenidate (FOCALIN) 10 MG tablet; Take 1 tablet (10 mg total) by mouth 2 (two) times daily.  Dispense: 60 tablet; Refill: 0 - dexmethylphenidate (FOCALIN) 10 MG tablet; Take 1 tablet (10 mg total) by mouth 2 (two) times daily.  Dispense: 60 tablet; Refill: 0  5. Vitamin D deficiency - has not started supplement, intends to now that school out, will recheck at 6 month follow up  6. Low ferritin - has not started iron supplement, plans to start soon, will recheck in 6 months   Clarene Reamer, FNP-BC  [facility] at [facility], [place]  05/20/2020 10:47 AM

## 2020-05-21 LAB — CYTOLOGY - PAP
Comment: NEGATIVE
Diagnosis: NEGATIVE
High risk HPV: NEGATIVE

## 2020-08-18 ENCOUNTER — Other Ambulatory Visit: Payer: Self-pay | Admitting: Internal Medicine

## 2020-08-18 ENCOUNTER — Other Ambulatory Visit: Payer: Self-pay | Admitting: Family Medicine

## 2020-08-18 DIAGNOSIS — Z1231 Encounter for screening mammogram for malignant neoplasm of breast: Secondary | ICD-10-CM

## 2020-08-26 ENCOUNTER — Other Ambulatory Visit: Payer: Self-pay

## 2020-08-26 ENCOUNTER — Ambulatory Visit
Admission: RE | Admit: 2020-08-26 | Discharge: 2020-08-26 | Disposition: A | Discharge status: Home / Self Care | Payer: Managed Care, Other (non HMO) | Source: Ambulatory Visit | Attending: Family Medicine | Admitting: Family Medicine

## 2020-08-26 DIAGNOSIS — Z1231 Encounter for screening mammogram for malignant neoplasm of breast: Secondary | ICD-10-CM | POA: Insufficient documentation

## 2020-09-24 ENCOUNTER — Other Ambulatory Visit: Payer: Self-pay

## 2020-09-24 ENCOUNTER — Other Ambulatory Visit: Payer: Self-pay | Admitting: Family Medicine

## 2020-09-24 DIAGNOSIS — F900 Attention-deficit hyperactivity disorder, predominantly inattentive type: Secondary | ICD-10-CM

## 2020-09-24 MED ORDER — DEXMETHYLPHENIDATE HCL 10 MG PO TABS: 10.0000 mg | ORAL_TABLET | Freq: Two times a day (BID) | ORAL | 0 refills | Status: DC

## 2020-09-24 NOTE — Telephone Encounter (Signed)
Pt left a message on triage VM for Focalin refill. Last OV 05-20-20 Next OV 12-02-20

## 2020-09-25 ENCOUNTER — Other Ambulatory Visit: Payer: Self-pay | Admitting: Family Medicine

## 2020-09-25 DIAGNOSIS — F900 Attention-deficit hyperactivity disorder, predominantly inattentive type: Secondary | ICD-10-CM

## 2020-09-25 MED ORDER — DEXMETHYLPHENIDATE HCL 10 MG PO TABS: 10.0000 mg | ORAL_TABLET | Freq: Two times a day (BID) | ORAL | 0 refills | Status: DC

## 2020-09-25 NOTE — Progress Notes (Signed)
E scripts down, had to resend prescriptions.

## 2020-10-29 ENCOUNTER — Other Ambulatory Visit: Payer: Self-pay | Admitting: Family Medicine

## 2020-11-02 ENCOUNTER — Other Ambulatory Visit: Payer: Self-pay

## 2020-11-02 MED ORDER — LISINOPRIL 10 MG PO TABS
10.0000 mg | ORAL_TABLET | Freq: Every day | ORAL | 3 refills | Status: DC
Start: 2020-11-02 — End: 2021-11-12

## 2020-11-24 ENCOUNTER — Telehealth: Payer: Self-pay | Admitting: Family Medicine

## 2020-11-24 NOTE — Telephone Encounter (Signed)
Please call patient and tell her that I am unable to fill her prescription for her ADD medication because she has not been seen in more than 6 months. If possible, please schedule her with me before my last day. Otherwise, she will need to establish with a new provider.

## 2020-11-24 NOTE — Telephone Encounter (Signed)
Pt called in wanted to know about getting the medicine Dexmethylphenidate refilled

## 2020-11-25 ENCOUNTER — Other Ambulatory Visit: Payer: Self-pay | Admitting: Family Medicine

## 2020-11-25 NOTE — Telephone Encounter (Signed)
Noted  

## 2020-11-25 NOTE — Telephone Encounter (Signed)
Contacted pt and advised PCP msg. Scheduled pt for apt for 12/20. Pt said she does not think she has any med left, she is not sure if pharmacy said they have a few pills left or not. Pt wants to know if PCP will send in just a few pills if pharmacy does not have any. Advised a msg would be sent if pharmacy does not have any and PCP would decide if a script would be sent.  Advised this nurse will contact them and let her know. Pt appreciative and verbalized understanding.    Contacted pharmacy and talked to Evon and she said they are filling a script they already had right now that is for 30 day supply, #60. That is the pt's last RF on that script.   Contacted pt and advised. Pt appreciative and will keep apt on 12/20.

## 2020-11-30 ENCOUNTER — Encounter: Payer: Self-pay | Admitting: Family Medicine

## 2020-11-30 ENCOUNTER — Ambulatory Visit (INDEPENDENT_AMBULATORY_CARE_PROVIDER_SITE_OTHER): Payer: Managed Care, Other (non HMO) | Admitting: Family Medicine

## 2020-11-30 ENCOUNTER — Other Ambulatory Visit: Payer: Self-pay

## 2020-11-30 VITALS — BP 144/80 | HR 79 | Temp 97.4°F | Ht 65.0 in | Wt 197.2 lb

## 2020-11-30 DIAGNOSIS — R790 Abnormal level of blood mineral: Secondary | ICD-10-CM

## 2020-11-30 DIAGNOSIS — Z6832 Body mass index (BMI) 32.0-32.9, adult: Secondary | ICD-10-CM

## 2020-11-30 DIAGNOSIS — E559 Vitamin D deficiency, unspecified: Secondary | ICD-10-CM | POA: Diagnosis not present

## 2020-11-30 DIAGNOSIS — E6609 Other obesity due to excess calories: Secondary | ICD-10-CM | POA: Diagnosis not present

## 2020-11-30 DIAGNOSIS — F900 Attention-deficit hyperactivity disorder, predominantly inattentive type: Secondary | ICD-10-CM

## 2020-11-30 MED ORDER — DEXMETHYLPHENIDATE HCL 10 MG PO TABS: 10.0000 mg | ORAL_TABLET | Freq: Two times a day (BID) | ORAL | 0 refills | Status: DC

## 2020-11-30 NOTE — Progress Notes (Signed)
Subjective:    Patient ID: Teresa Villarreal, female    DOB: 1971/03/24, 49 y.o.   MRN: [id number]  HPI Chief Complaint  Patient presents with  . Follow-up  . Medication Refill    This is a 49 yo female who presents today for follow up of chronic medical conditions.   ADD- has been on Focalin 10 mg BID. Works as a Education officer, [employer]. Great focus in mornings, feels like she crashes in the afternoon. Take am dose at 6, pm dose around 12. Around lunch, feels like she loses focus, doesn't seem to be as effective. Takes focalin 7 days a week.   Low ferritin- has not been taking iron due to potential for constipation. Her husband gives her a hard time about being on medication.   Obesity- increased sugar intake with high stress. Eats cookies, candy.   Vit d- didn't take any supplements as recommended  Review of Systems Headaches unchanged, occasional palpitations, no chest pain, no leg swelling    Objective:   Physical Exam  Physical Exam  Vitals reviewed. Constitutional: Oriented to person, place, and time. Appears well-developed and well-nourished. Obese.  HENT:  Head: Normocephalic and atraumatic.  Eyes: Conjunctivae are normal.  Neck: Normal range of motion. Neck supple.  Cardiovascular: Normal rate.   Pulmonary/Chest: Effort normal.  Musculoskeletal: Normal range of motion.  Neurological: Alert and oriented to person, place, and time.  Psychiatric: Normal mood and affect. Behavior is normal. Judgment and thought content normal.     BP (!) 150/82   Pulse 79   Temp (!) 97.4 F (36.3 C) (Temporal)   Ht 5\' 5"  (1.651 m)   Wt 197 lb 4 oz (89.5 kg)   SpO2 98%   BMI 32.82 kg/m  Wt Readings from Last 3 Encounters:  11/30/20 197 lb 4 oz (89.5 kg)  05/20/20 185 lb 12.8 oz (84.3 kg)  05/14/20 185 lb 13.6 oz (84.3 kg)       Assessment & Plan:  1. Attention deficit hyperactivity disorder (ADHD), predominantly inattentive type -Currently with some decreased focus later in the  day.  I suspect this is more related to her diet and consuming high carbohydrates. - dexmethylphenidate (FOCALIN) 10 MG tablet; Take 1 tablet (10 mg total) by mouth 2 (two) times daily.  Dispense: 60 tablet; Refill: 0 - dexmethylphenidate (FOCALIN) 10 MG tablet; Take 1 tablet (10 mg total) by mouth 2 (two) times daily.  Dispense: 60 tablet; Refill: 0 - dexmethylphenidate (FOCALIN) 10 MG tablet; Take 1 tablet (10 mg total) by mouth 2 (two) times daily.  Dispense: 60 tablet; Refill: 0  2. Vitamin D deficiency - Ferritin - VITAMIN D 25 Hydroxy (Vit-D Deficiency, Fractures) - Hemoglobin A1c  3. Low ferritin -Advised her to increase her dietary iron such as meat, leafy green vegetables, beans - Ferritin - VITAMIN D 25 Hydroxy (Vit-D Deficiency, Fractures) - Hemoglobin A1c  4. Class 1 obesity due to excess calories without serious comorbidity with body mass index (BMI) of 32.0 to 32.9 in adult -Provided written and verbal information regarding recommendations for healthy food choices - Ferritin - VITAMIN D 25 Hydroxy (Vit-D Deficiency, Fractures) - Hemoglobin A1c  -Follow-up in the spring with new provider for annual exam, transfer of care  This visit occurred during the SARS-CoV-2 public health emergency.  Safety protocols were in place, including screening questions prior to the visit, additional usage of staff PPE, and extensive cleaning of exam room while observing appropriate contact time as indicated for disinfecting solutions.  Clarene Reamer, FNP-BC  [facility] at [facility], [facility]  11/30/2020 9:57 AM

## 2020-11-30 NOTE — Patient Instructions (Addendum)
Good to see you today  Follow up for your annual exam in March  I will notify you of labs  There is not one right eating plan for everyone.  It may take trial and error to find what will work for you.  It is important to get adequate protein and fiber with your meals.  It is okay to not eat breakfast or to skip meals if you are not hungry.  Avoid snacking between meals.  Unless you are on a fluid restriction, drink 80 to 90 ounces of water a day.  Suggested resources- www.dietdoctor.com/diabetes/diet www.adaptyourlifeacademy.com-there is a quiz to help you determine how many carbohydrates you should eat a day  www.thefastingmethod.com  Here are some guidelines to help you with meal planning -  Avoid all processed and packaged foods (bread, pasta, crackers, chips, etc) and beverages containing calories.  Avoid added sugars and excessive natural sugars.  Pay attention to how you feel if you consume artificial sweeteners.  Do they make you more hungry or raise your blood sugar?  With every meal and snack, aim to get 20 g of protein (3 ounces of meat, 4 ounces of fish, 3 eggs, protein powder, 1 cup [place] yogurt, 1 cup cottage cheese, etc.)  Increase fiber in the form of non-starchy vegetables.  These help you feel full with very little carbohydrates and are good for gut health.  Nonstarchy vegetables include summer squash, onions, peppers, tomatoes, eggplant, broccoli, cauliflower, cabbage, lettuce, spinach.  Have small amounts of good fats such as avocado, nuts, olive oil, nut butters, olives.  Add a little cheese to your meals to make them tasty.   Try to plan your meals for the week and do some meal preparation when able.  If possible, make lunches for the week ahead of time.  Plan a couple of dinners and make enough so you can have leftovers.  Build in a treat once a week.

## 2020-12-01 LAB — VITAMIN D 25 HYDROXY (VIT D DEFICIENCY, FRACTURES): Vit D, 25-Hydroxy: 18.6 ng/mL — ABNORMAL LOW (ref 30.0–100.0)

## 2020-12-01 LAB — HEMOGLOBIN A1C
Est. average glucose Bld gHb Est-mCnc: 100 mg/dL
Hgb A1c MFr Bld: 5.1 % (ref 4.8–5.6)

## 2020-12-01 LAB — FERRITIN: Ferritin: 35 ng/mL (ref 15–150)

## 2020-12-02 ENCOUNTER — Ambulatory Visit: Payer: Managed Care, Other (non HMO) | Admitting: Family Medicine

## 2020-12-16 ENCOUNTER — Ambulatory Visit
Admission: EM | Admit: 2020-12-16 | Discharge: 2020-12-16 | Disposition: A | Discharge status: Home / Self Care | Payer: Managed Care, Other (non HMO) | Attending: Family Medicine | Admitting: Family Medicine

## 2020-12-16 ENCOUNTER — Other Ambulatory Visit: Payer: Self-pay

## 2020-12-16 ENCOUNTER — Telehealth: Payer: Self-pay | Admitting: *Deleted

## 2020-12-16 DIAGNOSIS — B373 Candidiasis of vulva and vagina: Secondary | ICD-10-CM | POA: Diagnosis not present

## 2020-12-16 DIAGNOSIS — B3731 Acute candidiasis of vulva and vagina: Secondary | ICD-10-CM

## 2020-12-16 DIAGNOSIS — Z1152 Encounter for screening for COVID-19: Secondary | ICD-10-CM

## 2020-12-16 DIAGNOSIS — B349 Viral infection, unspecified: Secondary | ICD-10-CM

## 2020-12-16 MED ORDER — NYSTATIN-TRIAMCINOLONE 100000-0.1 UNIT/GM-% EX CREA: TOPICAL_CREAM | CUTANEOUS | 0 refills | Status: DC

## 2020-12-16 MED ORDER — FLUCONAZOLE 150 MG PO TABS: 150.0000 mg | ORAL_TABLET | Freq: Every day | ORAL | 0 refills | Status: DC

## 2020-12-16 NOTE — Telephone Encounter (Signed)
Noted agree with plan for evaluation

## 2020-12-16 NOTE — ED Provider Notes (Signed)
Teresa Villarreal    CSN: [id number] Arrival date & time: 12/16/20  [id number]      History   Chief Complaint Chief Complaint  Patient presents with  . Vaginal Discharge  . Headache    HPI Teresa Villarreal is a 50 y.o. female.   Patient is a 50 year old female who presents today with 2 complaints.  First complaint being vaginal discharge, itching, irritation.  This is been for the past couple days.  Just completed a round of antibiotics.  Tried Monistat with worsening symptoms.  No abdominal pain, dysuria, hematuria or urinary frequency. She is also had headache, body aches and fever that developed yesterday.  Patient is a Pharmacist, hospital.  No known sick contacts or recent traveling.     Past Medical History:  Diagnosis Date  . Chicken pox   . Depression   . Frequent headaches    stress, approx 2x/wk  . Genital warts   . History of kidney stones   . Hyperlipidemia   . Hypertension   . Motion sickness    amusement park rides  . PONV (postoperative nausea and vomiting)     Patient Active Problem List   Diagnosis Date Noted  . Menorrhagia 03/06/2020  . History of kidney stones   . Pyelonephritis 08/19/2019  . Allergic rhinitis 04/08/2019  . GERD (gastroesophageal reflux disease) 10/22/2018  . Fatigue 10/22/2018  . Essential hypertension 05/12/2017  . 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 Anxiety and depression 07/29/2015  . Frequent headaches 07/29/2015  . HLD (hyperlipidemia) 07/29/2015  . Knee pain 04/22/2013  . Overweight 04/22/2013  . Ganglion of right wrist 06/01/2012  . Vitamin D deficiency 03/18/2012    Past Surgical History:  Procedure Laterality Date  . CYSTOSCOPY  2010  . ESOPHAGOGASTRODUODENOSCOPY (EGD) WITH PROPOFOL N/A 11/22/2018   Procedure: ESOPHAGOGASTRODUODENOSCOPY (EGD) WITH PROPOFOL;  Surgeon: Lucilla Lame, MD;  Location: [place];  Service: Endoscopy;  Laterality: N/A;  . HYSTEROSCOPY   2010  . KNEE ARTHROSCOPY Left 1990    OB History   No obstetric history on file.      Home Medications    Prior to Admission medications   Medication Sig Start Date End Date Taking? Authorizing Provider  fluconazole (DIFLUCAN) 150 MG tablet Take 1 tablet (150 mg total) by mouth daily. Take 1 tab today and then 1 tab in 3 days if still having symptoms 12/16/20  Yes Semira Stoltzfus A, NP  nystatin-triamcinolone (MYCOLOG II) cream Apply to affected area daily 12/16/20  Yes Knox Holdman A, NP  buPROPion (WELLBUTRIN XL) 300 MG 24 hr tablet TAKE 1 TABLET BY MOUTH EVERY DAY 11/26/20   Elby Beck, FNP  dexmethylphenidate (FOCALIN) 10 MG tablet Take 1 tablet (10 mg total) by mouth 2 (two) times daily. 11/30/20   Elby Beck, FNP  dexmethylphenidate (FOCALIN) 10 MG tablet Take 1 tablet (10 mg total) by mouth 2 (two) times daily. 11/30/20   Elby Beck, FNP  dexmethylphenidate (FOCALIN) 10 MG tablet Take 1 tablet (10 mg total) by mouth 2 (two) times daily. 11/30/20   Elby Beck, FNP  escitalopram (LEXAPRO) 20 MG tablet TAKE 1 TABLET BY MOUTH EVERY DAY 10/29/20   Elby Beck, FNP  fluticasone (FLONASE) 50 MCG/ACT nasal spray Place 2 sprays into both nostrils daily. 05/20/20   Elby Beck, FNP  ibuprofen (ADVIL,MOTRIN) 200 MG tablet Take 200 mg by mouth as needed.    [provider]  lisinopril (ZESTRIL)  10 MG tablet Take 1 tablet (10 mg total) by mouth daily. 11/02/20   Emi Belfast, FNP  pseudoephedrine (SUDAFED) 30 MG tablet Take 30 mg by mouth as needed for congestion.    [provider]    Family History Family History  Problem Relation Age of Onset  . Hyperlipidemia Mother   . Heart disease Mother   . Hypertension Mother   . Alzheimer's disease Mother   . COPD Mother   . Hyperlipidemia Father   . Hypertension Father   . Mitochondrial disorder Brother        Mitochondrial encephalomyopathy lactic acidosis  . Breast cancer  Paternal Aunt   . Arthritis Maternal Grandmother     Social History Social History   Tobacco Use  . Smoking status: Never Smoker  . Smokeless tobacco: Never Used  Vaping Use  . Vaping Use: Never used  Substance Use Topics  . Alcohol use: Yes    Alcohol/week: 0.0 standard drinks    Comment: occasional - 1x/mo  . Drug use: No     Allergies   Patient has no known allergies.   Review of Systems Review of Systems   Physical Exam Triage Vital Signs ED Triage Vitals  Enc Vitals Group     BP 12/16/20 0952 127/87     Pulse Rate 12/16/20 0952 84     Resp 12/16/20 0952 18     Temp 12/16/20 0952 99.3 F (37.4 C)     Temp src --      SpO2 12/16/20 0952 98 %     Weight --      Height --      Head Circumference --      Peak Flow --      Pain Score 12/16/20 0953 0     Pain Loc --      Pain Edu? --      Excl. in GC? --    No data found.  Updated Vital Signs BP 127/87   Pulse 84   Temp 99.3 F (37.4 C)   Resp 18   LMP  (LMP Unknown)   SpO2 98%   Visual Acuity Right Eye Distance:   Left Eye Distance:   Bilateral Distance:    Right Eye Near:   Left Eye Near:    Bilateral Near:     Physical Exam Vitals and nursing note reviewed.  Constitutional:      General: She is not in acute distress.    Appearance: Normal appearance. She is not ill-appearing, toxic-appearing or diaphoretic.  HENT:     Head: Normocephalic.  Eyes:     Conjunctiva/sclera: Conjunctivae normal.  Pulmonary:     Effort: Pulmonary effort is normal.  Musculoskeletal:        General: Normal range of motion.     Cervical back: Normal range of motion.  Skin:    General: Skin is warm and dry.     Findings: No rash.  Neurological:     Mental Status: She is alert.  Psychiatric:        Mood and Affect: Mood normal.      UC Treatments / Results  Labs (all labs ordered are listed, but only abnormal results are displayed) Labs Reviewed  COVID-19, FLU A+B NAA    EKG   Radiology No  results found.  Procedures Procedures (including critical care time)  Medications Ordered in UC Medications - No data to display  Initial Impression / Assessment and Plan / UC Course  I have reviewed the triage vital signs and the nursing notes.  Pertinent labs & imaging results that were available during my care of the patient were reviewed by me and considered in my medical decision making (see chart for details).     Viral illness Patient with 1 day of viral type symptoms.  Will send Covid and flu swab Recommend over-the-counter medicines as needed.  Yeast vaginitis Treating with Mycolog cream and Diflucan Follow up as needed for continued or worsening symptoms  Final Clinical Impressions(s) / UC Diagnoses   Final diagnoses:  Yeast vaginitis  Viral illness     Discharge Instructions     Medications as prescribed for yeast infection. Covid swab pending.  Can take over-the-counter medicines as needed. Follow up as needed for continued or worsening symptoms     ED Prescriptions    Medication Sig Dispense Auth. Provider   fluconazole (DIFLUCAN) 150 MG tablet Take 1 tablet (150 mg total) by mouth daily. Take 1 tab today and then 1 tab in 3 days if still having symptoms 2 tablet Roneka Gilpin A, NP   nystatin-triamcinolone (MYCOLOG II) cream Apply to affected area daily 15 g Genice Kimberlin A, NP     PDMP not reviewed this encounter.   Orvan July, NP 12/16/20 1036

## 2020-12-16 NOTE — Discharge Instructions (Addendum)
Medications as prescribed for yeast infection. Covid swab pending.  Can take over-the-counter medicines as needed. Follow up as needed for continued or worsening symptoms

## 2020-12-16 NOTE — Telephone Encounter (Signed)
Patient called stating that she did a tele doc visit and was given an antibiotic for a sinus infection. Patient stated that she was [name] NP patient and plans go getting established with [name]. Patient stated that now she has what she thinks is a yeast infection. Patient stated that she now has a headache, fever, body aches and chills. Patient stated that she did a rapid home covid test which was negative. 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 Patient was advised that we do not have any appointment available today at our office. Patient was given information on [facility]. Patient stated that she will plan on going there today to be evaluated and have further testing done.

## 2020-12-16 NOTE — ED Triage Notes (Signed)
Pt presents with c/o yeast infection after completion of antibiotics, then developed body aches and fever that developed yesterday

## 2020-12-18 LAB — COVID-19, FLU A+B NAA
Influenza A, NAA: NOT DETECTED
Influenza B, NAA: NOT DETECTED
SARS-CoV-2, NAA: NOT DETECTED

## 2020-12-22 ENCOUNTER — Telehealth: Payer: Self-pay | Admitting: *Deleted

## 2020-12-22 ENCOUNTER — Other Ambulatory Visit: Payer: Self-pay

## 2020-12-22 ENCOUNTER — Ambulatory Visit
Admission: EM | Admit: 2020-12-22 | Discharge: 2020-12-22 | Disposition: A | Discharge status: Home / Self Care | Payer: Managed Care, Other (non HMO) | Attending: Internal Medicine | Admitting: Internal Medicine

## 2020-12-22 ENCOUNTER — Encounter: Payer: Self-pay | Admitting: Emergency Medicine

## 2020-12-22 DIAGNOSIS — N760 Acute vaginitis: Secondary | ICD-10-CM | POA: Diagnosis not present

## 2020-12-22 MED ORDER — LIDOCAINE HCL URETHRAL/MUCOSAL 2 % EX GEL: 1.0000 "application " | CUTANEOUS | 0 refills | Status: DC | PRN

## 2020-12-22 NOTE — Telephone Encounter (Signed)
Agree with evaluation

## 2020-12-22 NOTE — ED Triage Notes (Signed)
Pt sts recently took antibiotics followed by diflucan for yeast post antibiotics; pt sts now having ulcerations that are painful to genital area; denies hx of hsv

## 2020-12-22 NOTE — Telephone Encounter (Signed)
Patient called stating that she now has sores on her vaginal that are painful. Patient stated that the sores are painful when she urinates. Patient stated that sometimes the pain is so bad that she feels nauseated. Patient stated that she has been using monostat and that has not helped and the sores are getting worse. Patient stated that she is at work now and they are short staffed with 5 teachers outs. Patient was advised unfortunately we do not have any appointments available at the office today or tomorrow. Patient was advised with the pain that she is having she should not wait until Thursday to be seen. Patient was advised that she should go to an UC to be evaluated. Patient stated that she will have to go to an UC after she gets off from work. Patient was given information on the UC at [facility] since they are open until 8:00 pm.  Patient stated that once she gets this problem cleared up she will call and set up a TOC appointment with another provider since Tor Netters NP is no longer here.

## 2020-12-22 NOTE — ED Provider Notes (Signed)
[facility]    CSN: [id number] Arrival date & time: 12/22/20  1619      History   Chief Complaint Chief Complaint  Patient presents with  . Rash    HPI Teresa Villarreal is a 50 y.o. female comes to the urgent care with with vaginal pain and irritation which started a few days ago.  Patient was treated for vaginal candidiasis with fluconazole.  Describes a painful rash on the labia minora.   Vaginal discharge has decreased.  She has some dysuria but no urgency or frequency.  HPI  Past Medical History:  Diagnosis Date  . Chicken pox   . Depression   . Frequent headaches    stress, approx 2x/wk  . Genital warts   . History of kidney stones   . Hyperlipidemia   . Hypertension   . Motion sickness    amusement park rides  . PONV (postoperative nausea and vomiting)     Patient Active Problem List   Diagnosis Date Noted  . Menorrhagia 03/06/2020  . History of kidney stones   . Pyelonephritis 08/19/2019  . Allergic rhinitis 04/08/2019  . GERD (gastroesophageal reflux disease) 10/22/2018  . Fatigue 10/22/2018  . Essential hypertension 05/12/2017  . 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 Anxiety and depression 07/29/2015  . Frequent headaches 07/29/2015  . HLD (hyperlipidemia) 07/29/2015  . Knee pain 04/22/2013  . Overweight 04/22/2013  . Ganglion of right wrist 06/01/2012  . Vitamin D deficiency 03/18/2012    Past Surgical History:  Procedure Laterality Date  . CYSTOSCOPY  2010  . ESOPHAGOGASTRODUODENOSCOPY (EGD) WITH PROPOFOL N/A 11/22/2018   Procedure: ESOPHAGOGASTRODUODENOSCOPY (EGD) WITH PROPOFOL;  Surgeon: Lucilla Lame, MD;  Location: [facility];  Service: Endoscopy;  Laterality: N/A;  . HYSTEROSCOPY  2010  . KNEE ARTHROSCOPY Left 1990    OB History   No obstetric history on file.      Home Medications    Prior to Admission medications   Medication Sig Start Date End Date  Taking? Authorizing Provider  lidocaine (XYLOCAINE) 2 % jelly Place 1 application into the urethra as needed. 12/22/20  Yes Kindsey Eblin, Myrene Galas, MD  buPROPion (WELLBUTRIN XL) 300 MG 24 hr tablet TAKE 1 TABLET BY MOUTH EVERY DAY 11/26/20   Elby Beck, FNP  dexmethylphenidate (FOCALIN) 10 MG tablet Take 1 tablet (10 mg total) by mouth 2 (two) times daily. 11/30/20   Elby Beck, FNP  dexmethylphenidate (FOCALIN) 10 MG tablet Take 1 tablet (10 mg total) by mouth 2 (two) times daily. 11/30/20   Elby Beck, FNP  dexmethylphenidate (FOCALIN) 10 MG tablet Take 1 tablet (10 mg total) by mouth 2 (two) times daily. 11/30/20   Elby Beck, FNP  escitalopram (LEXAPRO) 20 MG tablet TAKE 1 TABLET BY MOUTH EVERY DAY 10/29/20   Elby Beck, FNP  fluconazole (DIFLUCAN) 150 MG tablet Take 1 tablet (150 mg total) by mouth daily. Take 1 tab today and then 1 tab in 3 days if still having symptoms 12/16/20   Loura Halt A, NP  fluticasone (FLONASE) 50 MCG/ACT nasal spray Place 2 sprays into both nostrils daily. 05/20/20   Elby Beck, FNP  ibuprofen (ADVIL,MOTRIN) 200 MG tablet Take 200 mg by mouth as needed.    [provider]  lisinopril (ZESTRIL) 10 MG tablet Take 1 tablet (10 mg total) by mouth daily. 11/02/20   Elby Beck, FNP  nystatin-triamcinolone (MYCOLOG II) cream Apply to affected  area daily 12/16/20   Loura Halt A, NP  pseudoephedrine (SUDAFED) 30 MG tablet Take 30 mg by mouth as needed for congestion.    [provider]    Family History Family History  Problem Relation Age of Onset  . Hyperlipidemia Mother   . Heart disease Mother   . Hypertension Mother   . Alzheimer's disease Mother   . COPD Mother   . Hyperlipidemia Father   . Hypertension Father   . Mitochondrial disorder Brother        Mitochondrial encephalomyopathy lactic acidosis  . Breast cancer Paternal Aunt   . Arthritis Maternal Grandmother     Social History Social  History   Tobacco Use  . Smoking status: Never Smoker  . Smokeless tobacco: Never Used  Vaping Use  . Vaping Use: Never used  Substance Use Topics  . Alcohol use: Yes    Alcohol/week: 0.0 standard drinks    Comment: occasional - 1x/mo  . Drug use: No     Allergies   Patient has no known allergies.   Review of Systems Review of Systems  Respiratory: Negative.   Genitourinary: Positive for dysuria and vaginal pain. Negative for frequency, hematuria, urgency, vaginal bleeding and vaginal discharge.  Musculoskeletal: Negative.   Neurological: Negative.      Physical Exam Triage Vital Signs ED Triage Vitals  Enc Vitals Group     BP 12/22/20 1800 (!) 152/82     Pulse Rate 12/22/20 1800 69     Resp 12/22/20 1800 18     Temp 12/22/20 1800 97.6 F (36.4 C)     Temp Source 12/22/20 1800 Oral     SpO2 12/22/20 1800 97 %     Weight --      Height --      Head Circumference --      Peak Flow --      Pain Score 12/22/20 1812 8     Pain Loc --      Pain Edu? --      Excl. in [place]? --    No data found.  Updated Vital Signs BP (!) 152/82   Pulse 69   Temp 97.6 F (36.4 C) (Oral)   Resp 18   LMP  (LMP Unknown)   SpO2 97%   Visual Acuity Right Eye Distance:   Left Eye Distance:   Bilateral Distance:    Right Eye Near:   Left Eye Near:    Bilateral Near:     Physical Exam Vitals and nursing note reviewed. Exam conducted with a chaperone present.  Cardiovascular:     Rate and Rhythm: Normal rate and regular rhythm.  Pulmonary:     Effort: Pulmonary effort is normal.     Breath sounds: Normal breath sounds.  Genitourinary:    Comments: Shallow ulceration on the right labia minora.  Minimal vaginal discharge that is whitish in color with no odor. Neurological:     Mental Status: She is alert.      UC Treatments / Results  Labs (all labs ordered are listed, but only abnormal results are displayed) Labs Reviewed  HSV CULTURE AND TYPING  CERVICOVAGINAL  ANCILLARY ONLY    EKG   Radiology No results found.  Procedures Procedures (including critical care time)  Medications Ordered in UC Medications - No data to display  Initial Impression / Assessment and Plan / UC Course  I have reviewed the triage vital signs and the nursing notes.  Pertinent labs & imaging  results that were available during my care of the patient were reviewed by me and considered in my medical decision making (see chart for details).     1.  Acute vaginitis: Cervical vaginal swab for BV, vaginal yeast Vaginal swab for HSV culture/PCR Lidocaine gel to be topically as needed We will call you with results if abnormal. If symptoms worsen please return to the urgent care to be reevaluated Final Clinical Impressions(s) / UC Diagnoses   Final diagnoses:  Acute vaginitis     Discharge Instructions     Please apply lidocaine gel sparingly to the affected areas as needed If you have worsening symptoms please return to the urgent care to be reevaluated We will call you with labs if abnormal.   ED Prescriptions    Medication Sig Dispense Auth. Provider   lidocaine (XYLOCAINE) 2 % jelly Place 1 application into the urethra as needed. 30 mL Zaydee Aina, Myrene Galas, MD     PDMP not reviewed this encounter.   Chase Picket, MD 12/22/20 1919

## 2020-12-22 NOTE — Discharge Instructions (Signed)
Please apply lidocaine gel sparingly to the affected areas as needed If you have worsening symptoms please return to the urgent care to be reevaluated We will call you with labs if abnormal.

## 2020-12-25 LAB — CERVICOVAGINAL ANCILLARY ONLY
Bacterial Vaginitis (gardnerella): POSITIVE — AB
Candida Glabrata: NEGATIVE
Candida Vaginitis: NEGATIVE
Comment: NEGATIVE
Comment: NEGATIVE
Comment: NEGATIVE

## 2020-12-25 LAB — HSV CULTURE AND TYPING

## 2020-12-29 ENCOUNTER — Telehealth (HOSPITAL_COMMUNITY): Payer: Self-pay | Admitting: Emergency Medicine

## 2020-12-29 MED ORDER — ACYCLOVIR 400 MG PO TABS: 400.0000 mg | ORAL_TABLET | Freq: Three times a day (TID) | ORAL | 0 refills | Status: AC

## 2020-12-29 MED ORDER — METRONIDAZOLE 500 MG PO TABS: 500.0000 mg | ORAL_TABLET | Freq: Two times a day (BID) | ORAL | 0 refills | Status: DC

## 2021-03-23 ENCOUNTER — Telehealth: Payer: Self-pay

## 2021-03-23 DIAGNOSIS — F900 Attention-deficit hyperactivity disorder, predominantly inattentive type: Secondary | ICD-10-CM

## 2021-03-23 MED ORDER — DEXMETHYLPHENIDATE HCL 10 MG PO TABS: 10.0000 mg | ORAL_TABLET | Freq: Two times a day (BID) | ORAL | 0 refills | Status: DC

## 2021-03-23 NOTE — Telephone Encounter (Signed)
Debbie pt.  Last OV 11/30/2020.  ERx 34mo supply.

## 2021-03-23 NOTE — Telephone Encounter (Signed)
  LAST APPOINTMENT DATE: 12/22/2020   NEXT APPOINTMENT DATE:@Visit  date not found  MEDICATION: DEXETHYLPHENIDATE  [facility] [address]  Let patient know to contact pharmacy at the end of the day to make sure medication is ready.  Please notify patient to allow 48-72 hours to process  Encourage patient to contact the pharmacy for refills or they can request refills through [place]:   LAST REFILL:  QTY:  REFILL DATE:    OTHER COMMENTS:    Okay for refill?  Please advise

## 2021-03-24 NOTE — Telephone Encounter (Signed)
Spoke with patient scheduled appointment

## 2021-04-23 ENCOUNTER — Encounter: Payer: Self-pay | Admitting: Family Medicine

## 2021-04-23 ENCOUNTER — Other Ambulatory Visit: Payer: Self-pay

## 2021-04-23 ENCOUNTER — Ambulatory Visit (INDEPENDENT_AMBULATORY_CARE_PROVIDER_SITE_OTHER): Payer: Managed Care, Other (non HMO) | Admitting: Family Medicine

## 2021-04-23 VITALS — BP 130/78 | HR 81 | Temp 98.4°F | Ht 65.0 in | Wt 195.2 lb

## 2021-04-23 DIAGNOSIS — I10 Essential (primary) hypertension: Secondary | ICD-10-CM

## 2021-04-23 DIAGNOSIS — M25511 Pain in right shoulder: Secondary | ICD-10-CM

## 2021-04-23 DIAGNOSIS — E559 Vitamin D deficiency, unspecified: Secondary | ICD-10-CM

## 2021-04-23 DIAGNOSIS — F900 Attention-deficit hyperactivity disorder, predominantly inattentive type: Secondary | ICD-10-CM | POA: Diagnosis not present

## 2021-04-23 DIAGNOSIS — F331 Major depressive disorder, recurrent, moderate: Secondary | ICD-10-CM

## 2021-04-23 MED ORDER — ESCITALOPRAM OXALATE 20 MG PO TABS: 1.0000 | ORAL_TABLET | Freq: Every day | ORAL | 1 refills | Status: DC

## 2021-04-23 MED ORDER — DEXMETHYLPHENIDATE HCL 10 MG PO TABS: 10.0000 mg | ORAL_TABLET | Freq: Two times a day (BID) | ORAL | 0 refills | Status: DC

## 2021-04-23 NOTE — Patient Instructions (Signed)
For right shoulder pain - possible rotator cuff injury (of supraspinatus), possible shoulder joint tear (labral tear). Do exercises provided today with resistance band, continue ibuprofen 400-600mg  with meals, heating pad. Gentle stretching. If no better, could return to see Dr Lorelei Pont.  Focalin refilled.  Return as needed or in 2-3 months for physical with fasting labs.

## 2021-04-23 NOTE — Progress Notes (Signed)
Patient ID: Teresa Villarreal, female    DOB: 1971-09-26, 50 y.o.   MRN: [id number]  This visit was conducted in person.  BP 130/78   Pulse 81   Temp 98.4 F (36.9 C) (Temporal)   Ht 5\' 5"  (1.651 m)   Wt 195 lb 4 oz (88.6 kg)   LMP  (Within Months)   SpO2 98%   BMI 32.49 kg/m    CC: transfer care visit  Subjective:   HPI: Teresa Villarreal is a 50 y.o. female presenting on 04/23/2021 for ADHD (Here for f/u.)   Previously saw Tor Netters.  School Pharmacist, hospital.   HTN - Compliant with current antihypertensive regimen of lisinopril 10mg  daily. Does check blood pressures at school: recently 150/80s, vs normal range. No low blood pressure readings or symptoms of dizziness/syncope. Denies HA, vision changes, CP/tightness, SOB, leg swelling. Tolerates ACEI well.   ADD - treated with focalin 10mg  bid. Takes at [facility] and again at 12pm. Finds this is effective but does note loss of effect at 5-6 hour mark. Dx age 33 yo by Dr Rexene Edison, formal psychological evaluation. Previously tried Vyvanse - didn't feel very effective.   MDD/anxiety - stable period on wellbutrin XL 300mg  daily and lexapro 20mg  daily.   R posterior shoulder pain started after playing volleyball with daughter about 1-2 months ago - muscle feels tight. She did have a fall at this time. Trouble lifting shoulder above 90 degrees. Resting arm over [employer] did help but notes persistent pain with inciting activity. She's tried biofreeze, heat, ibuprofen without much beneflt.      Relevant past medical, surgical, family and social history reviewed and updated as indicated. Interim medical history since our last visit reviewed. Allergies and medications reviewed and updated. Outpatient Medications Prior to Visit  Medication Sig Dispense Refill  . buPROPion (WELLBUTRIN XL) 300 MG 24 hr tablet TAKE 1 TABLET BY MOUTH EVERY DAY 90 tablet 2  . Cholecalciferol (VITAMIN D3 PO) Take by mouth daily.    [name] Kitchen ibuprofen (ADVIL,MOTRIN) 200 MG tablet  Take 200 mg by mouth as needed.    [name] Kitchen lisinopril (ZESTRIL) 10 MG tablet Take 1 tablet (10 mg total) by mouth daily. 90 tablet 3  . pseudoephedrine (SUDAFED) 30 MG tablet Take 30 mg by mouth as needed for congestion.    [name] Kitchen dexmethylphenidate (FOCALIN) 10 MG tablet Take 1 tablet (10 mg total) by mouth 2 (two) times daily. 60 tablet 0  . escitalopram (LEXAPRO) 20 MG tablet TAKE 1 TABLET BY MOUTH EVERY DAY 90 tablet 1  . fluconazole (DIFLUCAN) 150 MG tablet Take 1 tablet (150 mg total) by mouth daily. Take 1 tab today and then 1 tab in 3 days if still having symptoms 2 tablet 0  . fluticasone (FLONASE) 50 MCG/ACT nasal spray Place 2 sprays into both nostrils daily. 16 g PRN  . lidocaine (XYLOCAINE) 2 % jelly Place 1 application into the urethra as needed. 30 mL 0  . metroNIDAZOLE (FLAGYL) 500 MG tablet Take 1 tablet (500 mg total) by mouth 2 (two) times daily. 14 tablet 0  . nystatin-triamcinolone (MYCOLOG II) cream Apply to affected area daily 15 g 0   No facility-administered medications prior to visit.     Per HPI unless specifically indicated in ROS section below Review of Systems Objective:  BP 130/78   Pulse 81   Temp 98.4 F (36.9 C) (Temporal)   Ht 5\' 5"  (1.651 m)   Wt 195 lb 4 oz (88.6 kg)  LMP  (Within Months)   SpO2 98%   BMI 32.49 kg/m   Wt Readings from Last 3 Encounters:  04/23/21 195 lb 4 oz (88.6 kg)  11/30/20 197 lb 4 oz (89.5 kg)  05/20/20 185 lb 12.8 oz (84.3 kg)      Physical Exam Vitals and nursing note reviewed.  Constitutional:      Appearance: Normal appearance. She is not ill-appearing.  Neck:     Thyroid: No thyroid mass or thyromegaly.  Cardiovascular:     Rate and Rhythm: Normal rate and regular rhythm.     Pulses: Normal pulses.     Heart sounds: Normal heart sounds. No murmur heard.   Pulmonary:     Effort: Pulmonary effort is normal. No respiratory distress.     Breath sounds: Normal breath sounds. No wheezing, rhonchi or rales.   Musculoskeletal:     Right lower leg: No edema.     Left lower leg: No edema.     Comments:  L shoulder WNL R shoulder exam: No deformity of shoulders on inspection. Discomfort to palpation medial upper left shoulder blade No pain with palpation of shoulder landmarks. FROM in abduction and forward flexion, discomfort at end ranges. Discomfort with testing SITS in int rotation. ++ pain with empty can sign. Mild discomfort with Speed test. Discomfort with impingement. No pain with rotation of humeral head in [facility] joint but with evident cracking.   Skin:    General: Skin is warm and dry.     Findings: No rash.  Neurological:     Mental Status: She is alert.  Psychiatric:        Mood and Affect: Mood normal.        Behavior: Behavior normal.       Depression screen [facility] 2/9 05/20/2020 09/11/2018 08/08/2018 10/27/2016 07/29/2015  Decreased Interest 0 1 2 0 0  Down, Depressed, Hopeless 0 1 2 0 3  PHQ - 2 Score 0 2 4 0 3  Altered sleeping - 0 3 - 3  Tired, decreased energy - 1 3 - 3  Change in appetite - 0 2 - 3  Feeling bad or failure about yourself  - 0 3 - 3  Trouble concentrating - 1 2 - 0  Moving slowly or fidgety/restless - 0 1 - 0  Suicidal thoughts - 0 0 - 0  PHQ-9 Score - 4 18 - 15  Difficult doing work/chores - Not difficult at all Extremely dIfficult - -    GAD 7 : Generalized Anxiety Score 09/11/2018 08/08/2018  Nervous, Anxious, on Edge 1 1  Control/stop worrying 0 1  Worry too much - different things 0 3  Trouble relaxing 1 1  Restless 0 0  Easily annoyed or irritable 1 3  Afraid - awful might happen 0 0  Total GAD 7 Score 3 9  Anxiety Difficulty Not difficult at all Very difficult   Assessment & Plan:  This visit occurred during the SARS-CoV-2 public health emergency.  Safety protocols were in place, including screening questions prior to the visit, additional usage of staff PPE, and extensive cleaning of exam room while observing appropriate contact time as  indicated for disinfecting solutions.   Problem List Items Addressed This Visit    MDD (major depressive disorder), recurrent episode, moderate (HCC)    Overall stable period on lexapro, wellbutrin XL 300mg  daily. Continue current regimen. Will need PHQ next visit.       Relevant Medications   escitalopram (LEXAPRO) 20  MG tablet   Attention deficit hyperactivity disorder (ADHD), predominantly inattentive type    Stable period on focalin 10mg  BID, tolerating regimen well. Continue this.  Will need updated UDS and controlled substance agreement next visit (last UDS 02/2020, last agreement 03/2017)      Relevant Medications   dexmethylphenidate (FOCALIN) 10 MG tablet   Essential hypertension    Chronic, stable on lisinopril 10mg  daily.       Vitamin D deficiency    Continue vit D daily -will need to verify dosing.       Right shoulder pain - Primary    Present for ~2 months, anticipate RTC injury less likely GH labral tear. Supportive care reviewed, exercises provided from [facility] pt advisor, rec continue heat, gentle stretching, ibuprofen (GIB cautions while on SSRI). If not improving, to return to see Dr Lorelei Pont sports medicine.           Meds ordered this encounter  Medications  . dexmethylphenidate (FOCALIN) 10 MG tablet    Sig: Take 1 tablet (10 mg total) by mouth 2 (two) times daily.    Dispense:  60 tablet    Refill:  0  . escitalopram (LEXAPRO) 20 MG tablet    Sig: Take 1 tablet (20 mg total) by mouth daily.    Dispense:  90 tablet    Refill:  1   No orders of the defined types were placed in this encounter.   Patient Instructions  For right shoulder pain - possible rotator cuff injury (of supraspinatus), possible shoulder joint tear (labral tear). Do exercises provided today with resistance band, continue ibuprofen 400-600mg  with meals, heating pad. Gentle stretching. If no better, could return to see Dr Lorelei Pont.  Focalin refilled.  Return as needed or in 2-3 months for  physical with fasting labs.    Follow up plan: Return in about 2 months (around 06/23/2021) for annual exam, prior fasting for blood work.  Ria Bush, MD

## 2021-04-23 NOTE — Assessment & Plan Note (Signed)
Chronic, stable on lisinopril 10mg daily.  

## 2021-04-23 NOTE — Assessment & Plan Note (Signed)
Overall stable period on lexapro, wellbutrin XL 300mg  daily. Continue current regimen. Will need PHQ next visit.

## 2021-04-23 NOTE — Assessment & Plan Note (Addendum)
Stable period on focalin 10mg  BID, tolerating regimen well. Continue this.  Will need updated UDS and controlled substance agreement next visit (last UDS 02/2020, last agreement 03/2017)

## 2021-04-23 NOTE — Assessment & Plan Note (Signed)
Continue vit D daily -will need to verify dosing.

## 2021-04-23 NOTE — Assessment & Plan Note (Signed)
Present for ~2 months, anticipate RTC injury less likely GH labral tear. Supportive care reviewed, exercises provided from [facility] pt advisor, rec continue heat, gentle stretching, ibuprofen (GIB cautions while on SSRI). If not improving, to return to see Dr Lorelei Pont sports medicine.

## 2021-05-12 ENCOUNTER — Encounter: Payer: Self-pay | Admitting: Family Medicine

## 2021-05-31 ENCOUNTER — Other Ambulatory Visit: Payer: Self-pay

## 2021-05-31 DIAGNOSIS — F900 Attention-deficit hyperactivity disorder, predominantly inattentive type: Secondary | ICD-10-CM

## 2021-05-31 NOTE — Telephone Encounter (Signed)
New message    1. Which medications need to be refilled? (please list name of each medication and dose if known) dexmethylphenidate (FOCALIN) 10 MG tablet  2. Which pharmacy/location (including street and city if local pharmacy) is medication [facility] sent to?[id number] [address], [place]  3. Do they need a 30 day or 90 day supply? 30 day  supply

## 2021-05-31 NOTE — Telephone Encounter (Signed)
Name of Medication: [place] Name [facility] Last Venida Jarvis or Written Date and Quantity: 04/23/21, #60 Last Office Visit and Type: 04/23/21, med f/u Next Office Visit and Type: none Last Controlled Substance Agreement Date: 03/21/17 Last UDS: 03/21/17

## 2021-06-01 MED ORDER — DEXMETHYLPHENIDATE HCL 10 MG PO TABS: 10.0000 mg | ORAL_TABLET | Freq: Two times a day (BID) | ORAL | 0 refills | Status: DC

## 2021-06-01 NOTE — Telephone Encounter (Signed)
Patient advised.

## 2021-06-01 NOTE — Telephone Encounter (Signed)
Patient is calling back to check on this. Patient is out of the medication now. Please review and let patient know when this is done please.

## 2021-06-01 NOTE — Telephone Encounter (Signed)
Plz notify this was refilled. Thanks.

## 2021-06-01 NOTE — Telephone Encounter (Signed)
Patient also wanted to know if Dr Darnell Level would be able to send in 3 RXs separately for the patient to the pharmacy to be placed on hold for this medication so she does not have to call every month and run out. Dr Marjory Lies and Tor Netters did that for her and she wanted to ask

## 2021-07-02 ENCOUNTER — Telehealth: Payer: Self-pay

## 2021-07-02 NOTE — Telephone Encounter (Signed)
Pt calling to get refill on dexmethylphenidate 10 mg; pt should have available refills at [place]; I called [place] and spoke with wendy and there is an available refill on hold for dexmethylphenidate 10 mg and will be ready for pick up on 07/03/21. Pt said she has 2 pills left [facility]; pt appreciative and nothing further needed.

## 2021-08-30 ENCOUNTER — Other Ambulatory Visit: Payer: Self-pay | Admitting: Family Medicine

## 2021-08-30 DIAGNOSIS — F900 Attention-deficit hyperactivity disorder, predominantly inattentive type: Secondary | ICD-10-CM

## 2021-08-30 NOTE — Telephone Encounter (Signed)
Name of Medication: [place] [facility] [place] or Written Date and Quantity: 08/01/21 #60 Last Office Visit and Type: 04/23/21 Next Office Visit and Type: 04/23/21 Last Controlled Substance Agreement Date: 03/21/17 Last UDS:03/06/20

## 2021-08-30 NOTE — Telephone Encounter (Signed)
Pt called stating she needs a refill ondexmethylphenidate (FOCALIN) 10 MG tablet and she  took the last pill today.

## 2021-08-31 MED ORDER — DEXMETHYLPHENIDATE HCL 10 MG PO TABS: 10.0000 mg | ORAL_TABLET | Freq: Two times a day (BID) | ORAL | 0 refills | Status: DC

## 2021-08-31 NOTE — Telephone Encounter (Signed)
ERx 

## 2021-09-23 ENCOUNTER — Ambulatory Visit: Admit: 2021-09-23 | Payer: Managed Care, Other (non HMO)

## 2021-09-23 ENCOUNTER — Other Ambulatory Visit: Payer: Self-pay

## 2021-09-23 ENCOUNTER — Emergency Department
Admission: EM | Admit: 2021-09-23 | Discharge: 2021-09-23 | Disposition: A | Discharge status: Home / Self Care | Payer: Managed Care, Other (non HMO) | Attending: Emergency Medicine | Admitting: Emergency Medicine

## 2021-09-23 DIAGNOSIS — K219 Gastro-esophageal reflux disease without esophagitis: Secondary | ICD-10-CM | POA: Insufficient documentation

## 2021-09-23 DIAGNOSIS — R109 Unspecified abdominal pain: Secondary | ICD-10-CM

## 2021-09-23 DIAGNOSIS — Z79899 Other long term (current) drug therapy: Secondary | ICD-10-CM | POA: Insufficient documentation

## 2021-09-23 DIAGNOSIS — R630 Anorexia: Secondary | ICD-10-CM | POA: Insufficient documentation

## 2021-09-23 DIAGNOSIS — I10 Essential (primary) hypertension: Secondary | ICD-10-CM | POA: Diagnosis not present

## 2021-09-23 DIAGNOSIS — R1084 Generalized abdominal pain: Secondary | ICD-10-CM | POA: Insufficient documentation

## 2021-09-23 DIAGNOSIS — K5900 Constipation, unspecified: Secondary | ICD-10-CM | POA: Insufficient documentation

## 2021-09-23 LAB — CBC
HCT: 36.4 % (ref 36.0–46.0)
Hemoglobin: 13 g/dL (ref 12.0–15.0)
MCH: 33.2 pg (ref 26.0–34.0)
MCHC: 35.7 g/dL (ref 30.0–36.0)
MCV: 92.9 fL (ref 80.0–100.0)
Platelets: 293 10*3/uL (ref 150–400)
RBC: 3.92 MIL/uL (ref 3.87–5.11)
RDW: 12.2 % (ref 11.5–15.5)
WBC: 7.3 10*3/uL (ref 4.0–10.5)
nRBC: 0 % (ref 0.0–0.2)

## 2021-09-23 LAB — COMPREHENSIVE METABOLIC PANEL
ALT: 21 U/L (ref 0–44)
AST: 20 U/L (ref 15–41)
Albumin: 4.1 g/dL (ref 3.5–5.0)
Alkaline Phosphatase: 96 U/L (ref 38–126)
Anion gap: 8 (ref 5–15)
BUN: 18 mg/dL (ref 6–20)
CO2: 26 mmol/L (ref 22–32)
Calcium: 9.3 mg/dL (ref 8.9–10.3)
Chloride: 102 mmol/L (ref 98–111)
Creatinine, Ser: 0.74 mg/dL (ref 0.44–1.00)
GFR, Estimated: >60 mL/min (ref >60)
Glucose, Bld: 100 mg/dL — ABNORMAL HIGH (ref 70–99)
Potassium: 3.8 mmol/L (ref 3.5–5.1)
Sodium: 136 mmol/L (ref 135–145)
Total Bilirubin: 0.6 mg/dL (ref 0.3–1.2)
Total Protein: 7.1 g/dL (ref 6.5–8.1)

## 2021-09-23 LAB — URINALYSIS, COMPLETE (UACMP) WITH MICROSCOPIC
Bacteria, UA: NONE SEEN
Bilirubin Urine: NEGATIVE
Glucose, UA: NEGATIVE mg/dL
Hgb urine dipstick: NEGATIVE
Ketones, ur: NEGATIVE mg/dL
Leukocytes,Ua: NEGATIVE
Nitrite: NEGATIVE
Protein, ur: NEGATIVE mg/dL
Specific Gravity, Urine: 1.004 — ABNORMAL LOW (ref 1.005–1.030)
WBC, UA: NONE SEEN WBC/hpf (ref 0–5)
pH: 5 (ref 5.0–8.0)

## 2021-09-23 LAB — PREGNANCY, URINE: Preg Test, Ur: NEGATIVE

## 2021-09-23 LAB — LIPASE, BLOOD: Lipase: 40 U/L (ref 11–51)

## 2021-09-23 MED ORDER — DOCUSATE SODIUM 100 MG PO CAPS: 100.0000 mg | ORAL_CAPSULE | Freq: Two times a day (BID) | ORAL | 0 refills | Status: AC

## 2021-09-23 MED ORDER — DICYCLOMINE HCL 10 MG PO CAPS: 10.0000 mg | ORAL_CAPSULE | Freq: Three times a day (TID) | ORAL | 0 refills | Status: DC

## 2021-09-23 MED ORDER — DICYCLOMINE HCL 20 MG PO TABS
20.0000 mg | ORAL_TABLET | Freq: Once | ORAL | Status: AC
  Administered 2021-09-23: 20 mg via ORAL
  Filled 2021-09-23: qty 1

## 2021-09-23 NOTE — ED Triage Notes (Signed)
Pt comes with c/o abdominal pain and dark colored stools. Pt states she noticed this yesterday. Pt states the pain has gotten progressively worse the last week.

## 2021-09-23 NOTE — ED Provider Notes (Signed)
Emergency Medicine Provider Triage Evaluation Note  Teresa Villarreal, a 49 y.o. female  was evaluated in triage.  Pt complains of generalized abdominal pain and dark stools. She notes nausea, chills, but denies frank fevers. She noted onset yesterday of the dark stools, with gradual of the abdominal pain since last week.  Review of Systems  Positive: Abd pain, dark stools Negative: Vomiting, diarrhea  Physical Exam  BP 124/88   Pulse 83   Temp 98 F (36.7 C)   Resp 18   SpO2 96%  Gen:   Awake, no distress  NAD Resp:  Normal effort CTA MSK:   Moves extremities without difficulty  Other:  ABD: soft, nontender  Medical Decision Making  Medically screening exam initiated at 4:17 PM.  Appropriate orders placed.  Berlin Viereck was informed that the remainder of the evaluation will be completed by another provider, this initial triage assessment does not replace that evaluation, and the importance of remaining in the ED until their evaluation is complete.  Patient with ED evaluation of generalized abdominal pain and dark stools.    Melvenia Needles, PA-C 09/23/21 1620    Vladimir Crofts, MD 09/23/21 2114

## 2021-09-24 ENCOUNTER — Encounter: Payer: Self-pay | Admitting: Family Medicine

## 2021-09-24 DIAGNOSIS — R194 Change in bowel habit: Secondary | ICD-10-CM

## 2021-09-24 NOTE — ED Provider Notes (Signed)
[facility]  ____________________________________________   Event Date/Time   First MD Initiated Contact with Patient 09/23/21 2145     (approximate)  I have reviewed the triage vital signs and the nursing notes.   HISTORY  Chief Complaint Abdominal Pain    HPI Teresa Villarreal is a 50 y.o. female with past medical history of depression, hypertension, hyperlipidemia presents with abdominal pain and constipation.  Patient notes that she has been constipated recently.  She does not take any stool softeners as she does not feel like this has been really effective for her in the past.  She usually eats cereal and this can make her have a bowel movement.  She has had some generalized abdominal cramping.  This is intermittent, she still eating and drinking but has had decreased p.o. intake of the last 2 days in particular.  Yesterday she had a stool that looked dark to her, she says that it is not tarry but does look nearly black.  She had several small episodes today that looked dark as well.  She denies any bright light red blood per rectum.  Denies nausea vomiting.  No fevers or chills, denies urinary symptoms.         Past Medical History:  Diagnosis Date   Chicken pox    Depression    Frequent headaches    stress, approx 2x/wk   Genital warts    History of kidney stones    Hyperlipidemia    Hypertension    Motion sickness    amusement park rides   PONV (postoperative nausea and vomiting)     Patient Active Problem List   Diagnosis Date Noted   Right shoulder pain 04/23/2021   Menorrhagia 03/06/2020   History of kidney stones    Pyelonephritis 08/19/2019   Allergic rhinitis 04/08/2019   GERD (gastroesophageal reflux disease) 10/22/2018   Fatigue 10/22/2018   Essential hypertension 05/12/2017   Attention deficit hyperactivity disorder (ADHD), 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   MDD (major depressive disorder), recurrent episode, moderate  ([place]) 07/29/2015   Frequent headaches 07/29/2015   HLD (hyperlipidemia) 07/29/2015   Knee pain 04/22/2013   Obesity, Class I, BMI 30-34.9 04/22/2013   Ganglion of right wrist 06/01/2012   Vitamin D deficiency 03/18/2012    Past Surgical History:  Procedure Laterality Date   CYSTOSCOPY  2010   ESOPHAGOGASTRODUODENOSCOPY (EGD) WITH PROPOFOL N/A 11/22/2018   Procedure: ESOPHAGOGASTRODUODENOSCOPY (EGD) WITH PROPOFOL;  Surgeon: Lucilla Lame, MD;  Location: [facility];  Service: Endoscopy;  Laterality: N/A;   HYSTEROSCOPY  2010   KNEE ARTHROSCOPY Left 1990    Prior to Admission medications   Medication Sig Start Date End Date Taking? Authorizing Provider  dicyclomine (BENTYL) 10 MG capsule Take 1 capsule (10 mg total) by mouth 4 (four) times daily -  before meals and at bedtime. 09/23/21 10/23/21 Yes Rada Hay, MD  docusate sodium (COLACE) 100 MG capsule Take 1 capsule (100 mg total) by mouth 2 (two) times daily. 09/23/21 10/23/21 Yes Rada Hay, MD  buPROPion (WELLBUTRIN XL) 300 MG 24 hr tablet TAKE 1 TABLET BY MOUTH EVERY DAY 11/26/20   Elby Beck, FNP  Cholecalciferol (VITAMIN D3 PO) Take by mouth daily.    [provider]  dexmethylphenidate (FOCALIN) 10 MG tablet Take 1 tablet (10 mg total) by mouth 2 (two) times daily. 08/31/21   Ria Bush, MD  dexmethylphenidate (FOCALIN) 10 MG tablet Take 1 tablet (10 mg total) by mouth 2 (two)  times daily. 10/31/21   Ria Bush, MD  dexmethylphenidate (FOCALIN) 10 MG tablet Take 1 tablet (10 mg total) by mouth 2 (two) times daily. 09/30/21   Ria Bush, MD  escitalopram (LEXAPRO) 20 MG tablet Take 1 tablet (20 mg total) by mouth daily. 04/23/21   Ria Bush, MD  ibuprofen (ADVIL,MOTRIN) 200 MG tablet Take 200 mg by mouth as needed.    [provider]  lisinopril (ZESTRIL) 10 MG tablet Take 1 tablet (10 mg total) by mouth daily. 11/02/20   Elby Beck, FNP   pseudoephedrine (SUDAFED) 30 MG tablet Take 30 mg by mouth as needed for congestion.    [provider]    Allergies Patient has no known allergies.  Family History  Problem Relation Age of Onset   Hyperlipidemia Mother    Heart disease Mother    Hypertension Mother    Alzheimer's disease Mother    COPD Mother    Hyperlipidemia Father    Hypertension Father    Mitochondrial disorder Brother        Mitochondrial encephalomyopathy lactic acidosis   Breast cancer Paternal Aunt    Arthritis Maternal Grandmother     Social History Social History   Tobacco Use   Smoking status: Never   Smokeless tobacco: Never  Vaping Use   Vaping Use: Never used  Substance Use Topics   Alcohol use: Yes    Alcohol/week: 0.0 standard drinks    Comment: occasional - 1x/mo   Drug use: No    Review of Systems   Review of Systems  Constitutional:  Positive for appetite change. Negative for fever.  Gastrointestinal:  Positive for abdominal pain. Negative for constipation, diarrhea, nausea and vomiting.  Genitourinary:  Negative for dysuria.  All other systems reviewed and are negative.  Physical Exam Updated Vital Signs BP 123/78   Pulse 65   Temp 98 F (36.7 C)   Resp 18   SpO2 98%   Physical Exam Vitals and nursing note reviewed.  Constitutional:      General: She is not in acute distress.    Appearance: Normal appearance.  HENT:     Head: Normocephalic and atraumatic.  Eyes:     General: No scleral icterus.    Conjunctiva/sclera: Conjunctivae normal.  Pulmonary:     Effort: Pulmonary effort is normal. No respiratory distress.     Breath sounds: No stridor.  Abdominal:     General: Abdomen is flat.     Palpations: Abdomen is soft.     Tenderness: There is abdominal tenderness.     Comments: Mild tenderness to palpation in the suprapubic region, no guarding  Genitourinary:    Comments: Brown stool on rectal exam Musculoskeletal:        General: No deformity or  signs of injury.     Cervical back: Normal range of motion.  Skin:    General: Skin is dry.     Coloration: Skin is not jaundiced or pale.  Neurological:     General: No focal deficit present.     Mental Status: She is alert and oriented to person, place, and time. Mental status is at baseline.  Psychiatric:        Mood and Affect: Mood normal.        Behavior: Behavior normal.     LABS (all labs ordered are listed, but only abnormal results are displayed)  Labs Reviewed  COMPREHENSIVE METABOLIC PANEL - Abnormal; Notable for the following components:  Result Value   Glucose, Bld 100 (*)    All other components within normal limits  URINALYSIS, COMPLETE (UACMP) WITH MICROSCOPIC - Abnormal; Notable for the following components:   Color, Urine STRAW (*)    APPearance CLEAR (*)    Specific Gravity, Urine 1.004 (*)    All other components within normal limits  LIPASE, BLOOD  CBC  PREGNANCY, URINE   ____________________________________________  EKG  N/a ____________________________________________  RADIOLOGY Almeta Monas, personally viewed and evaluated these images (plain radiographs) as part of my medical decision making, as well as reviewing the written report by the radiologist.  ED MD interpretation:  n/a    ____________________________________________   PROCEDURES  Procedure(s) performed (including Critical Care):  Procedures   ____________________________________________   INITIAL IMPRESSION / ASSESSMENT AND PLAN / ED COURSE    50 year old female presents with intermittent abdominal cramping constipation and dark stool.  Vital signs within normal limits.  She is very well-appearing.  Does not have active abdominal pain at the moment.  Abdomen is benign, there is mild tenderness to palpation in the suprapubic region.Labs are reassuring, she has no leukocytosis lipase and LFTs all within normal limits.  UA is negative.  I have very low suspicion  for surgical pathology given her benign exam and reassuring labs.  She has been constipated which I suspect is the cause of her abdominal cramping.  In terms of her dark stools, she has brown stool on rectal exam stable hemoglobin and no other risk factors for GI bleeding.  I am not concerned about upper GI bleed at this time.  Will write for Colace and Bentyl.  Patient is stable for discharge. ____________________________________________   FINAL CLINICAL IMPRESSION(S) / ED DIAGNOSES  Final diagnoses:  Abdominal cramps     ED Discharge Orders          Ordered    dicyclomine (BENTYL) 10 MG capsule  3 times daily before meals & bedtime        09/23/21 2222    docusate sodium (COLACE) 100 MG capsule  2 times daily        09/23/21 2222             Note:  This document was prepared using Dragon voice recognition software and may include unintentional dictation errors.    Rada Hay, MD 09/24/21 [phone]

## 2021-10-30 ENCOUNTER — Other Ambulatory Visit: Payer: Self-pay | Admitting: Family Medicine

## 2021-11-02 ENCOUNTER — Telehealth: Payer: Self-pay | Admitting: Family Medicine

## 2021-11-02 DIAGNOSIS — E611 Iron deficiency: Secondary | ICD-10-CM | POA: Insufficient documentation

## 2021-11-02 DIAGNOSIS — E785 Hyperlipidemia, unspecified: Secondary | ICD-10-CM

## 2021-11-02 DIAGNOSIS — E559 Vitamin D deficiency, unspecified: Secondary | ICD-10-CM

## 2021-11-02 NOTE — Telephone Encounter (Signed)
Labs ordered for [facility] ?

## 2021-11-02 NOTE — Telephone Encounter (Signed)
Called Mrs. Teresa Villarreal and wanted to know about getting lab done a [facility] for her CPE AND ITS ON 12/23 @12 

## 2021-11-02 NOTE — Telephone Encounter (Signed)
Patient notified as instructed by telephone and verbalized understanding. 

## 2021-11-02 NOTE — Telephone Encounter (Signed)
Called patient and scheduled for 12/23 @12 

## 2021-11-11 ENCOUNTER — Other Ambulatory Visit: Payer: Self-pay | Admitting: Family Medicine

## 2021-11-12 ENCOUNTER — Telehealth: Payer: Self-pay | Admitting: Family Medicine

## 2021-11-12 MED ORDER — LISINOPRIL 10 MG PO TABS: 10.0000 mg | ORAL_TABLET | Freq: Every day | ORAL | 0 refills | Status: DC

## 2021-11-12 NOTE — Telephone Encounter (Signed)
Spoke to patient by telephone and was advised that the pharmacy told her that they had sent the refill request twice. Advised patient that the refill has been sent in for her now. Patient stated that she has been out of medication for 4 days and her blood pressure is elevated.Patient stated that her blood pressure has been 164/88 and 158//90. 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 Patient stated that she has had a headache and felt a little foggy. Patient stated that she will pick the medication up as soon as she leaves work today and take it.  Patient was given ER precautions and she verbalized understanding

## 2021-11-12 NOTE — Addendum Note (Signed)
Addended by: Emelia Salisbury C on: 11/12/2021 01:46 PM   Modules accepted: Orders

## 2021-11-12 NOTE — Telephone Encounter (Signed)
  Encourage patient to contact the pharmacy for refills or they can request refills through [facility]:  Please schedule appointment if longer than 1 year  NEXT APPOINTMENT DATE:12/03/21  MEDICATION:lisinopril (ZESTRIL) 10 MG tablet  Is the patient out of medication? yes  PHARMACY:[id number] [address], [place]  Let patient know to contact pharmacy at the end of the day to make sure medication is ready.  Please notify patient to allow 48-72 hours to process  CLINICAL FILLS OUT ALL BELOW:   LAST REFILL:  QTY:  REFILL DATE:    OTHER COMMENTS:    Okay for refill?  Please advise

## 2021-11-12 NOTE — Telephone Encounter (Signed)
We just received lexapro refill request. Thank you for sending in lisinopril as well.

## 2021-12-02 LAB — COMPREHENSIVE METABOLIC PANEL
ALT: 30 IU/L (ref 0–32)
AST: 23 IU/L (ref 0–40)
Albumin/Globulin Ratio: 2.1 (ref 1.2–2.2)
Albumin: 4.5 g/dL (ref 3.8–4.9)
Alkaline Phosphatase: 127 IU/L — ABNORMAL HIGH (ref 44–121)
BUN/Creatinine Ratio: 23 (ref 9–23)
BUN: 18 mg/dL (ref 6–24)
Bilirubin Total: 0.3 mg/dL (ref 0.0–1.2)
CO2: 23 mmol/L (ref 20–29)
Calcium: 9.4 mg/dL (ref 8.7–10.2)
Chloride: 103 mmol/L (ref 96–106)
Creatinine, Ser: 0.8 mg/dL (ref 0.57–1.00)
Globulin, Total: 2.1 g/dL (ref 1.5–4.5)
Glucose: 96 mg/dL (ref 70–99)
Potassium: 4.4 mmol/L (ref 3.5–5.2)
Sodium: 142 mmol/L (ref 134–144)
Total Protein: 6.6 g/dL (ref 6.0–8.5)
eGFR: 89 mL/min/{1.73_m2} (ref >59)

## 2021-12-02 LAB — LIPID PANEL
Chol/HDL Ratio: 6.5 ratio — ABNORMAL HIGH (ref 0.0–4.4)
Cholesterol, Total: 293 mg/dL — ABNORMAL HIGH (ref 100–199)
HDL: 45 mg/dL (ref >39)
LDL Chol Calc (NIH): 209 mg/dL — ABNORMAL HIGH (ref 0–99)
Triglycerides: 201 mg/dL — ABNORMAL HIGH (ref 0–149)
VLDL Cholesterol Cal: 39 mg/dL (ref 5–40)

## 2021-12-02 LAB — IRON,TIBC AND FERRITIN PANEL
Ferritin: 71 ng/mL (ref 15–150)
Iron Saturation: 23 % (ref 15–55)
Iron: 79 ug/dL (ref 27–159)
Total Iron Binding Capacity: 340 ug/dL (ref 250–450)
UIBC: 261 ug/dL (ref 131–425)

## 2021-12-02 LAB — VITAMIN D 25 HYDROXY (VIT D DEFICIENCY, FRACTURES): Vit D, 25-Hydroxy: 21.8 ng/mL — ABNORMAL LOW (ref 30.0–100.0)

## 2021-12-03 ENCOUNTER — Other Ambulatory Visit: Payer: Self-pay | Admitting: Family Medicine

## 2021-12-03 ENCOUNTER — Other Ambulatory Visit: Payer: Self-pay

## 2021-12-03 ENCOUNTER — Encounter: Payer: Self-pay | Admitting: Family Medicine

## 2021-12-03 ENCOUNTER — Ambulatory Visit (INDEPENDENT_AMBULATORY_CARE_PROVIDER_SITE_OTHER): Payer: Managed Care, Other (non HMO) | Admitting: Family Medicine

## 2021-12-03 VITALS — BP 122/78 | HR 87 | Temp 97.8°F | Ht 65.0 in | Wt 194.5 lb

## 2021-12-03 DIAGNOSIS — Z23 Encounter for immunization: Secondary | ICD-10-CM

## 2021-12-03 DIAGNOSIS — I10 Essential (primary) hypertension: Secondary | ICD-10-CM | POA: Diagnosis not present

## 2021-12-03 DIAGNOSIS — R748 Abnormal levels of other serum enzymes: Secondary | ICD-10-CM

## 2021-12-03 DIAGNOSIS — K5909 Other constipation: Secondary | ICD-10-CM

## 2021-12-03 DIAGNOSIS — Z1211 Encounter for screening for malignant neoplasm of colon: Secondary | ICD-10-CM

## 2021-12-03 DIAGNOSIS — Z0000 Encounter for general adult medical examination without abnormal findings: Secondary | ICD-10-CM | POA: Insufficient documentation

## 2021-12-03 DIAGNOSIS — E785 Hyperlipidemia, unspecified: Secondary | ICD-10-CM | POA: Diagnosis not present

## 2021-12-03 DIAGNOSIS — F900 Attention-deficit hyperactivity disorder, predominantly inattentive type: Secondary | ICD-10-CM

## 2021-12-03 DIAGNOSIS — F331 Major depressive disorder, recurrent, moderate: Secondary | ICD-10-CM

## 2021-12-03 DIAGNOSIS — N951 Menopausal and female climacteric states: Secondary | ICD-10-CM

## 2021-12-03 DIAGNOSIS — R400 Somnolence: Secondary | ICD-10-CM | POA: Insufficient documentation

## 2021-12-03 DIAGNOSIS — Z0001 Encounter for general adult medical examination with abnormal findings: Secondary | ICD-10-CM

## 2021-12-03 DIAGNOSIS — R5382 Chronic fatigue, unspecified: Secondary | ICD-10-CM

## 2021-12-03 DIAGNOSIS — E611 Iron deficiency: Secondary | ICD-10-CM

## 2021-12-03 DIAGNOSIS — R519 Headache, unspecified: Secondary | ICD-10-CM

## 2021-12-03 DIAGNOSIS — E559 Vitamin D deficiency, unspecified: Secondary | ICD-10-CM

## 2021-12-03 DIAGNOSIS — E669 Obesity, unspecified: Secondary | ICD-10-CM

## 2021-12-03 MED ORDER — ESCITALOPRAM OXALATE 20 MG PO TABS: 20.0000 mg | ORAL_TABLET | Freq: Every day | ORAL | 3 refills | Status: DC

## 2021-12-03 MED ORDER — BUPROPION HCL ER (XL) 450 MG PO TB24: 450.0000 mg | ORAL_TABLET | Freq: Every day | ORAL | 3 refills | Status: DC

## 2021-12-03 MED ORDER — LISINOPRIL 10 MG PO TABS: 10.0000 mg | ORAL_TABLET | Freq: Every day | ORAL | 3 refills | Status: DC

## 2021-12-03 MED ORDER — DEXMETHYLPHENIDATE HCL 10 MG PO TABS: 10.0000 mg | ORAL_TABLET | Freq: Two times a day (BID) | ORAL | 0 refills | Status: DC

## 2021-12-03 MED ORDER — VITAMIN D3 50 MCG (2000 UT) PO CAPS: 2000.0000 [IU] | ORAL_CAPSULE | Freq: Every day | ORAL | Status: DC

## 2021-12-03 NOTE — Assessment & Plan Note (Signed)
Preventative protocols reviewed and updated unless pt declined. Discussed healthy diet and lifestyle.  

## 2021-12-03 NOTE — Assessment & Plan Note (Signed)
Refill focalin which she tolerates well and has been on longterm. Update UDS/controlled substance agreement today.

## 2021-12-03 NOTE — Assessment & Plan Note (Addendum)
Significant symptoms of daytime somnolence, loud snoring, non-restorative sleep. Will refer for sleep apnea evaluation in [place].  ESS = 20!

## 2021-12-03 NOTE — Assessment & Plan Note (Signed)
Chronic, stable on lisinopril 10mg  daily - continue.

## 2021-12-03 NOTE — Progress Notes (Addendum)
Patient ID: Teresa Villarreal, female    DOB: 09-04-1971, 50 y.o.   MRN: [id number]  This visit was conducted in person.  BP 122/78    Pulse 87    Temp 97.8 F (36.6 C) (Temporal)    Ht _0  (1.651 m)    Wt 194 lb 8 oz (88.2 kg)    SpO2 98%    BMI 32.37 kg/m    CC: CPE  Subjective:   HPI: Teresa Villarreal is a 50 y.o. female presenting on 12/03/2021 for Annual Exam (Will need refill for vit D3 if needs to restart. )   ADHD dx 6 yrs ago - continues focalin 48m BID with benefit.  Long-term on lexapro 241m  Increased stressors at home and work as well as [facility] eating more sugars and carbs. Notes 15 lb weight gain over 4 months. Requests referral to counselor.   Trouble sleeping too much. Feels sleepy during the day. Focalin helps. Does snore loudly. Stays with a headache but no morning headache. No witnessed apnea. Daytime somnolence. Sister with OSA has significantly benefited from CPAP.   Chronic constipation - no regular bowel regimen. No blood in stool. Has tried miralax. Enemas help.   Preventative: Colon cancer screening [place] @ [place] [facility] exam - last pap normal 05/2020 - h/o HPV and herpes. Rpt 3-5 yrs.  LMP 01/2021 - perimenopausal  DEXA scan - not due Lung cancer screening - not due Flu shot - declined COVID shot - Moderna 02/2020, 03/2020  Tap 2012, update today Pneumonia shot - not due Shingrix - discussed, start today Seat belt use discussed Sunscreen use discussed. No changing moles on skin. 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 Interim medical history since our  last visit reviewed. Allergies and medications reviewed and updated. Outpatient Medications Prior to Visit  Medication Sig Dispense Refill   buPROPion (WELLBUTRIN XL) 300 MG 24 hr tablet TAKE 1 TABLET BY MOUTH EVERY DAY 90 tablet 2   dexmethylphenidate (FOCALIN) 10 MG tablet Take 1 tablet (10 mg total) by mouth 2 (two) times daily. 60 tablet 0   escitalopram (LEXAPRO) 20 MG tablet TAKE 1 TABLET BY MOUTH EVERY DAY 90 tablet 0   lisinopril (ZESTRIL) 10 MG tablet Take 1 tablet (10 mg total) by mouth daily. 90 tablet 0   Cholecalciferol (VITAMIN D3 PO) Take by mouth daily. (Patient not taking: Reported on 12/03/2021)     dexmethylphenidate (FOCALIN) 10 MG tablet Take 1 tablet (10 mg total) by mouth 2 (two) times daily. 60 tablet 0   dexmethylphenidate (FOCALIN) 10 MG tablet Take 1 tablet (10 mg total) by mouth 2 (two) times daily. 60 tablet 0   dicyclomine (BENTYL) 10 MG capsule Take 1 capsule (10 mg total) by mouth 4 (four) times daily -  before meals and at bedtime. 120 capsule 0   ibuprofen (ADVIL,MOTRIN) 200 MG tablet Take 200 mg by mouth as needed.     pseudoephedrine (SUDAFED) 30 MG tablet Take 30 mg by mouth as needed for congestion.     No facility-administered medications prior to visit.  Per HPI unless specifically indicated in ROS section below Review of Systems  Constitutional:  Positive for appetite change. Negative for activity change, chills, fatigue, fever and unexpected weight change.  HENT:  Negative for hearing loss.   Eyes:  Negative for visual disturbance.  Respiratory:  Positive for cough and shortness of breath. Negative for chest tightness and wheezing.   Cardiovascular:  Negative for chest pain, palpitations and leg swelling.  Gastrointestinal:  Positive for constipation. Negative for abdominal distention, abdominal pain, blood in stool, diarrhea, nausea and vomiting.  Genitourinary:  Negative for difficulty urinating and hematuria.  Musculoskeletal:  Negative for  arthralgias, myalgias and neck pain.  Skin:  Negative for rash.  Neurological:  Positive for headaches. Negative for dizziness, seizures and syncope.  Hematological:  Negative for adenopathy. Does not bruise/bleed easily.  Psychiatric/Behavioral:  Positive for dysphoric mood. The patient is nervous/anxious.    Objective:  BP 122/78    Pulse 87    Temp 97.8 F (36.6 C) (Temporal)    Ht _0  (1.651 m)    Wt 194 lb 8 oz (88.2 kg)    SpO2 98%    BMI 32.37 kg/m   Wt Readings from Last 3 Encounters:  12/03/21 194 lb 8 oz (88.2 kg)  04/23/21 195 lb 4 oz (88.6 kg)  11/30/20 197 lb 4 oz (89.5 kg)      Physical Exam Vitals and nursing note reviewed.  Constitutional:      Appearance: Normal appearance. She is not ill-appearing.  HENT:     Head: Normocephalic and atraumatic.     Right Ear: Tympanic membrane, ear canal and external ear normal. There is no impacted cerumen.     Left Ear: Tympanic membrane, ear canal and external ear normal. There is no impacted cerumen.  Eyes:     General:        Right eye: No discharge.        Left eye: No discharge.     Extraocular Movements: Extraocular movements intact.     Conjunctiva/sclera: Conjunctivae normal.     Pupils: Pupils are equal, round, and reactive to light.  Neck:     Thyroid: No thyroid mass or thyromegaly.  Cardiovascular:     Rate and Rhythm: Normal rate and regular rhythm.     Pulses: Normal pulses.     Heart sounds: Normal heart sounds. No murmur heard. Pulmonary:     Effort: Pulmonary effort is normal. No respiratory distress.     Breath sounds: Normal breath sounds. No wheezing, rhonchi or rales.  Abdominal:     General: Bowel sounds are normal. There is no distension.     Palpations: Abdomen is soft. There is no mass.     Tenderness: There is no abdominal tenderness. There is no guarding or rebound.     Hernia: No hernia is present.  Musculoskeletal:     Cervical back: Normal range of motion and neck supple. No rigidity.      Right lower leg: No edema.     Left lower leg: No edema.  Lymphadenopathy:     Cervical: No cervical adenopathy.  Skin:    General: Skin is warm and dry.     Findings: No rash.  Neurological:     General: No focal deficit present.     Mental Status: She is alert. Mental status is at baseline.  Psychiatric:        Mood and Affect: Mood normal.        Behavior:  Behavior normal.      Results for orders placed or performed in visit on 11/02/21  Iron, TIBC and Ferritin Panel  Result Value Ref Range   Total Iron Binding Capacity 340 250 - 450 ug/dL   UIBC 261 131 - 425 ug/dL   Iron 79 27 - 159 ug/dL   Iron Saturation 23 15 - 55 %   Ferritin 71 15 - 150 ng/mL  Comprehensive metabolic panel  Result Value Ref Range   Glucose 96 70 - 99 mg/dL   BUN 18 6 - 24 mg/dL   Creatinine, Ser 0.80 0.57 - 1.00 mg/dL   eGFR 89 >59 mL/min/1.73   BUN/Creatinine Ratio 23 9 - 23   Sodium 142 134 - 144 mmol/L   Potassium 4.4 3.5 - 5.2 mmol/L   Chloride 103 96 - 106 mmol/L   CO2 23 20 - 29 mmol/L   Calcium 9.4 8.7 - 10.2 mg/dL   Total Protein 6.6 6.0 - 8.5 g/dL   Albumin 4.5 3.8 - 4.9 g/dL   Globulin, Total 2.1 1.5 - 4.5 g/dL   Albumin/Globulin Ratio 2.1 1.2 - 2.2   Bilirubin Total 0.3 0.0 - 1.2 mg/dL   Alkaline Phosphatase 127 (H) 44 - 121 IU/L   AST 23 0 - 40 IU/L   ALT 30 0 - 32 IU/L  Lipid panel  Result Value Ref Range   Cholesterol, Total 293 (H) 100 - 199 mg/dL   Triglycerides 201 (H) 0 - 149 mg/dL   HDL 45 >39 mg/dL   VLDL Cholesterol Cal 39 5 - 40 mg/dL   LDL Chol Calc (NIH) 209 (H) 0 - 99 mg/dL   Lipid Comment: Comment    Chol/HDL Ratio 6.5 (H) 0.0 - 4.4 ratio  VITAMIN D 25 Hydroxy (Vit-D Deficiency, Fractures)  Result Value Ref Range   Vit D, 25-Hydroxy 21.8 (L) 30.0 - 100.0 ng/mL   Lab Results  Component Value Date   TSH 0.99 03/06/2020    Depression screen [facility] 2/9 12/03/2021 05/20/2020 09/11/2018 08/08/2018 10/27/2016  Decreased Interest 3 0 1 2 0  Down, Depressed,  Hopeless 3 0 1 2 0  PHQ - 2 Score 6 0 2 4 0  Altered sleeping 3 - 0 3 -  Tired, decreased energy 3 - 1 3 -  Change in appetite 3 - 0 2 -  Feeling bad or failure about yourself  2 - 0 3 -  Trouble concentrating 2 - 1 2 -  Moving slowly or fidgety/restless 0 - 0 1 -  Suicidal thoughts 0 - 0 0 -  PHQ-9 Score 19 - 4 18 -  Difficult doing work/chores - - Not difficult at all Extremely dIfficult -    GAD 7 : Generalized Anxiety Score 12/03/2021 09/11/2018 08/08/2018  Nervous, Anxious, on Edge _0 Control/stop worrying 1 0 1  Worry too much - different things 2 0 3  Trouble relaxing _1 Restless 0 0 0  Easily annoyed or irritable _2 Afraid - awful might happen 0 0 0  Total GAD 7 Score _3 Anxiety Difficulty - Not difficult at all Very difficult   Assessment & Plan:  This visit occurred during the SARS-CoV-2 public health emergency.  Safety protocols were in place, including screening questions prior to the visit, additional usage of staff PPE, and extensive cleaning of exam room while observing appropriate contact time as indicated for disinfecting solutions.   Problem List Items Addressed  This Visit     Encounter for general adult medical examination with abnormal findings - Primary (Chronic)    Preventative protocols reviewed and updated unless pt declined. Discussed healthy diet and lifestyle.       MDD (major depressive disorder), recurrent episode, moderate (HCC)    Chronic, deteriorated around increased work and family stressors. She already is on lexapro 42m and wellbutrin 3012mdaily. Will increase wellbutrin XL to 45059mupdate with effect.  Provided with contact info for psychologists local to [place]     Relevant Medications   buPROPion 450 MG TB24   escitalopram (LEXAPRO) 20 MG tablet   Frequent headaches    Longstanding history of frequent headaches, but not morning headaches. Start with eval for OSA.       Relevant Medications   buPROPion 450 MG  TB24   escitalopram (LEXAPRO) 20 MG tablet   HLD (hyperlipidemia)    Chronic, acutely worse with LDL >200. Discussed this is indication to start statin. She also has strong fmhx CAD (both parents). Will work on [employer] and reassess control in 3 months. Add lipoprotein a to next labwork.  Reviewed diet choices to improve cholesterol levels.  The 10-year ASCVD risk score (Arnett DK, et al., 2019) is: 3.4%   Values used to calculate the score:     Age: [age]     Sex: Female     Is Non-Hispanic African American: No     Diabetic: No     Tobacco smoker: No     Systolic Blood Pressure: 122542Hg     Is BP treated: Yes     HDL Cholesterol: 45 mg/dL     Total Cholesterol: 293 mg/dL       Relevant Medications   lisinopril (ZESTRIL) 10 MG tablet   Other Relevant Orders   Hepatic function panel   Lipoprotein A (LPA)   Lipid panel   TSH   Attention deficit hyperactivity disorder (ADHD), predominantly inattentive type    Refill focalin which she tolerates well and has been on longterm. Update UDS/controlled substance agreement today.       Relevant Medications   dexmethylphenidate (FOCALIN) 10 MG tablet (Start on 01/03/2022)   Other Relevant Orders   DRUG MONITORING, PANEL 8 WITH CONFIRMATION, URINE   Essential hypertension    Chronic, stable on lisinopril 6m28mily - continue.       Relevant Medications   lisinopril (ZESTRIL) 10 MG tablet   Fatigue    Chronic issue. Start with OSA evaluation      Obesity, Class I, BMI 30-34.9    Encouraged ongoing healthy diet and lifestyle choices to affect sustainable weight loss.       Vitamin D deficiency    Has not been taking replacement - rec start 2000 IU daily.       Relevant Orders   VITAMIN D 25 Hydroxy (Vit-D Deficiency, Fractures)   Iron deficiency    Iron levels now normal off replacement. Stay off oral iron.       Daytime somnolence    Significant symptoms of daytime somnolence, loud snoring, non-restorative sleep. [place]      Perimenopause    LMP 01/2021. Discussed perimenopausal status.       Elevated alkaline phosphatase level    Mild elevation noted. Reassess at 3 mo lab visit.       Chronic constipation    ?IBS-C vs chronic constipation.  Discussed importance of water/fiber in diet and regular bowel regimen. rec start daily miralax or fiber supplement, update with effect. She desires to avoid Rx medication at this time.       Other Visit Diagnoses     Need for Tdap vaccination       Relevant Orders   Tdap vaccine greater than or equal to 7yo IM (Completed)   Need for shingles vaccine       Relevant Orders   Varicella-zoster vaccine IM (Completed)   Special screening for malignant neoplasms, colon       Relevant Orders   Ambulatory referral to Gastroenterology        Meds ordered this encounter  Medications   buPROPion 450 MG TB24    Sig: Take 450 mg by mouth daily.    Dispense:  90 tablet    Refill:  3   Cholecalciferol (VITAMIN D3) 50 MCG (2000 UT) capsule    Sig: Take 1 capsule (2,000 Units total) by mouth daily.   dexmethylphenidate (FOCALIN) 10 MG tablet    Sig: Take 1 tablet (10 mg total) by mouth 2 (two) times daily.    Dispense:  60 tablet    Refill:  0   escitalopram (LEXAPRO) 20 MG tablet    Sig: Take 1 tablet (20 mg total) by mouth daily.    Dispense:  90 tablet    Refill:  3   lisinopril (ZESTRIL) 10 MG tablet    Sig: Take 1 tablet (10 mg total) by mouth daily.    Dispense:  90 tablet    Refill:  3   dexmethylphenidate (FOCALIN) 10 MG tablet    Sig: Take 1 tablet (10 mg total) by mouth 2 (two) times daily.    Dispense:  60 tablet    Refill:  0   dexmethylphenidate (FOCALIN) 10 MG tablet    Sig: Take 1 tablet (10 mg total) by mouth 2 (two) times daily.    Dispense:  60 tablet    Refill:  0   Orders Placed This Encounter  Procedures   Tdap vaccine greater than or equal to 7yo IM   Varicella-zoster  vaccine IM   Hepatic function panel    Standing Status:   Future    Number of Occurrences:   1    Standing Expiration Date:   12/03/2022   VITAMIN D 25 Hydroxy (Vit-D Deficiency, Fractures)    Standing Status:   Future    Number of Occurrences:   1    Standing Expiration Date:   12/03/2022   Lipoprotein A (LPA)    Standing Status:   Future    Number of Occurrences:   1    Standing Expiration Date:   12/03/2022   DRUG MONITORING, PANEL 8 WITH CONFIRMATION, URINE    Focalin   Lipid panel    Standing Status:   Future    Number of Occurrences:   1    Standing Expiration Date:   12/04/2022   TSH    Standing Status:   Future    Number of Occurrences:   1    Standing Expiration Date:   12/04/2022   Ambulatory referral to Gastroenterology    Referral Priority:   Routine    Referral Type:   Consultation    Referral Reason:   Specialty Services Required    Number of Visits Requested:   1     Patient instructions: Shingrix today and Tdap today. Schedule nurse visit in 2-6 months  for second and final shingles shot  Schedule labs in 3 months - [facility] in [place] Urine screen and controlled substance agreement today  For chronic constipation - increase water and fiber in the diet. Take 1/2-1 capful daily of miralax or 1 tablespoon daily of soluble fiber supplement like benefiber or metamucil. May also use senokot as needed. Let me know if not improving to consider prescription medicines  We will refer you for colonoscopy.  List of local counselors provided today.  Try full dose wellbutrin XL 464m daily.  We will refer you for sleep apnea evaluation.  [phone]   Follow up plan: Return in about 1 year (around 12/03/2022), or if symptoms worsen or fail to improve, for annual exam, prior fasting for blood work.  JRia Bush MD

## 2021-12-03 NOTE — Patient Instructions (Addendum)
Shingrix today and Tdap today. Schedule nurse visit in 2-6 months for second and final shingles shot  Schedule labs in 3 months - [facility] in [place] Urine screen and controlled substance agreement today  For chronic constipation - increase water and fiber in the diet. Take 1/2-1 capful daily of miralax or 1 tablespoon daily of soluble fiber supplement like benefiber or metamucil. May also use senokot as needed. Let me know if not improving to consider prescription medicines  We will refer you for colonoscopy.  List of local counselors provided today.  Try full dose wellbutrin XL 450mg  daily.  We will refer you for sleep apnea evaluation.  Call to schedule mammogram at your convenience: [facility] at [facility] [phone].   Health Maintenance, Female Adopting a healthy lifestyle and getting preventive care are important in promoting health and wellness. Ask your health care provider about: The right schedule for you to have regular tests and exams. Things you can do on your own to prevent diseases and keep yourself healthy. What should I know about diet, weight, and exercise? Eat a healthy diet  Eat a diet that includes plenty of vegetables, fruits, low-fat dairy products, and lean protein. Do not eat a lot of foods that are high in solid fats, added sugars, or sodium. Maintain a healthy weight Body mass index (BMI) is used to identify weight problems. It estimates body fat based on height and weight. Your health care provider can help determine your BMI and help you achieve or maintain a healthy weight. Get regular exercise Get regular exercise. This is one of the most important things you can do for your health. Most adults should: Exercise for at least 150 minutes each week. The exercise should increase your heart rate and make you sweat (moderate-intensity exercise). Do strengthening exercises at least twice a week. This is in addition to the moderate-intensity  exercise. Spend less time sitting. Even light physical activity can be beneficial. Watch cholesterol and blood lipids Have your blood tested for lipids and cholesterol at 51 years of age, then have this test every 5 years. Have your cholesterol levels checked more often if: Your lipid or cholesterol levels are high. You are older than 51 years of age. You are at high risk for heart disease. What should I know about cancer screening? Depending on your health history and family history, you may need to have cancer screening at various ages. This may include screening for: Breast cancer. Cervical cancer. Colorectal cancer. Skin cancer. Lung cancer. What should I know about heart disease, diabetes, and high blood pressure? Blood pressure and heart disease High blood pressure causes heart disease and increases the risk of stroke. This is more likely to develop in people who have high blood pressure readings or are overweight. Have your blood pressure checked: Every 3-5 years if you are 23-78 years of age. Every year if you are 2 years old or older. Diabetes Have regular diabetes screenings. This checks your fasting blood sugar level. Have the screening done: Once every three years after age 46 if you are at a normal weight and have a low risk for diabetes. More often and at a younger age if you are overweight or have a high risk for diabetes. What should I know about preventing infection? Hepatitis B If you have a higher risk for hepatitis B, you should be screened for this virus. Talk with your health care provider to find out if you are at risk for hepatitis B  infection. Hepatitis C Testing is recommended for: Everyone born from [birth year] through 1966. Anyone with known risk factors for hepatitis C. Sexually transmitted infections (STIs) Get screened for STIs, including gonorrhea and chlamydia, if: You are sexually active and are younger than 51 years of age. You are older than 51 years  of age and your health care provider tells you that you are at risk for this type of infection. Your sexual activity has changed since you were last screened, and you are at increased risk for chlamydia or gonorrhea. Ask your health care provider if you are at risk. Ask your health care provider about whether you are at high risk for HIV. Your health care provider may recommend a prescription medicine to help prevent HIV infection. If you choose to take medicine to prevent HIV, you should first get tested for HIV. You should then be tested every 3 months for as long as you are taking the medicine. Pregnancy If you are about to stop having your period (premenopausal) and you may become pregnant, seek counseling before you get pregnant. Take 400 to 800 micrograms (mcg) of folic acid every day if you become pregnant. Ask for birth control (contraception) if you want to prevent pregnancy. Osteoporosis and menopause Osteoporosis is a disease in which the bones lose minerals and strength with aging. This can result in bone fractures. If you are 2 years old or older, or if you are at risk for osteoporosis and fractures, ask your health care provider if you should: Be screened for bone loss. Take a calcium or vitamin D supplement to lower your risk of fractures. Be given hormone replacement therapy (HRT) to treat symptoms of menopause. Follow these instructions at home: Alcohol use Do not drink alcohol if: Your health care provider tells you not to drink. You are pregnant, may be pregnant, or are planning to become pregnant. If you drink alcohol: Limit how much you have to: 0-1 drink a day. Know how much alcohol is in your drink. In the [place] one drink equals one 12 oz bottle of beer (355 mL), one 5 oz glass of wine (148 mL), or one 1 oz glass of hard liquor (44 mL). Lifestyle Do not use any products that contain nicotine or tobacco. These products include cigarettes, chewing tobacco, and vaping  devices, such as e-cigarettes. If you need help quitting, ask your health care provider. Do not use street drugs. Do not share needles. Ask your health care provider for help if you need support or information about quitting drugs. General instructions Schedule regular health, dental, and eye exams. Stay current with your vaccines. Tell your health care provider if: You often feel depressed. You have ever been abused or do not feel safe at home. Summary Adopting a healthy lifestyle and getting preventive care are important in promoting health and wellness. Follow your health care provider's instructions about healthy diet, exercising, and getting tested or screened for diseases. Follow your health care provider's instructions on monitoring your cholesterol and blood pressure. This information is not intended to replace advice given to you by your health care provider. Make sure you discuss any questions you have with your health care provider. Document Revised: 04/19/2021 Document Reviewed: 04/19/2021 Elsevier Patient Education  [place].

## 2021-12-03 NOTE — Assessment & Plan Note (Addendum)
Has not been taking replacement - rec start 2000 IU daily.

## 2021-12-04 ENCOUNTER — Other Ambulatory Visit: Payer: Self-pay | Admitting: Family Medicine

## 2021-12-04 DIAGNOSIS — K5909 Other constipation: Secondary | ICD-10-CM | POA: Insufficient documentation

## 2021-12-04 DIAGNOSIS — N951 Menopausal and female climacteric states: Secondary | ICD-10-CM | POA: Insufficient documentation

## 2021-12-04 DIAGNOSIS — R748 Abnormal levels of other serum enzymes: Secondary | ICD-10-CM | POA: Insufficient documentation

## 2021-12-04 DIAGNOSIS — R7401 Elevation of levels of liver transaminase levels: Secondary | ICD-10-CM | POA: Insufficient documentation

## 2021-12-04 LAB — DRUG MONITORING, PANEL 8 WITH CONFIRMATION, URINE
6 Acetylmorphine: NEGATIVE ng/mL (ref <10)
Alcohol Metabolites: NEGATIVE ng/mL (ref <500)
Amphetamines: NEGATIVE ng/mL (ref <500)
Benzodiazepines: NEGATIVE ng/mL (ref <100)
Buprenorphine, Urine: NEGATIVE ng/mL (ref <5)
Cocaine Metabolite: NEGATIVE ng/mL (ref <150)
Creatinine: 132.3 mg/dL (ref >=20.0)
MDMA: NEGATIVE ng/mL (ref <500)
Marijuana Metabolite: NEGATIVE ng/mL (ref <20)
Opiates: NEGATIVE ng/mL (ref <100)
Oxidant: NEGATIVE ug/mL (ref <200)
Oxycodone: NEGATIVE ng/mL (ref <100)
pH: 6.8 (ref 4.5–9.0)

## 2021-12-04 LAB — DM TEMPLATE

## 2021-12-04 NOTE — Assessment & Plan Note (Signed)
Chronic issue. Start with OSA evaluation

## 2021-12-04 NOTE — Assessment & Plan Note (Signed)
Iron levels now normal off replacement. Stay off oral iron.

## 2021-12-04 NOTE — Assessment & Plan Note (Signed)
Mild elevation noted. Reassess at 3 mo lab visit.

## 2021-12-04 NOTE — Assessment & Plan Note (Addendum)
Chronic, acutely worse with LDL >200. Discussed this is indication to start statin. She also has strong fmhx CAD (both parents). Will work on [employer] first and reassess control in 3 months. Add lipoprotein a to next labwork.  Reviewed diet choices to improve cholesterol levels.  The 10-year ASCVD risk score (Arnett DK, et al., 2019) is: 3.4%   Values used to calculate the score:     Age: 50 years     Sex: Female     Is Non-Hispanic African American: No     Diabetic: No     Tobacco smoker: No     Systolic Blood Pressure: 854 mmHg     Is BP treated: Yes     HDL Cholesterol: 45 mg/dL     Total Cholesterol: 293 mg/dL

## 2021-12-04 NOTE — Assessment & Plan Note (Signed)
Encouraged ongoing healthy diet and lifestyle choices to affect sustainable weight loss.  

## 2021-12-04 NOTE — Addendum Note (Signed)
Addended by: Ria Bush on: 12/04/2021 10:12 AM   Modules accepted: Orders

## 2021-12-04 NOTE — Assessment & Plan Note (Signed)
?  IBS-C vs chronic constipation.  Discussed importance of water/fiber in diet and regular bowel regimen. rec start daily miralax or fiber supplement, update with effect. She desires to avoid Rx medication at this time.

## 2021-12-04 NOTE — Telephone Encounter (Signed)
Resubmitted. No alternative recommended at this time.

## 2021-12-04 NOTE — Assessment & Plan Note (Signed)
Longstanding history of frequent headaches, but not morning headaches. Start with eval for OSA.

## 2021-12-04 NOTE — Assessment & Plan Note (Signed)
LMP 01/2021. Discussed perimenopausal status.

## 2021-12-04 NOTE — Assessment & Plan Note (Signed)
Chronic, deteriorated around increased work and family stressors. She already is on lexapro 20mg  and wellbutrin 300mg  daily. Will increase wellbutrin XL to 450mg , update with effect.  Provided with contact info for psychologists local to [place].

## 2021-12-10 ENCOUNTER — Other Ambulatory Visit: Payer: Self-pay

## 2021-12-10 DIAGNOSIS — Z1211 Encounter for screening for malignant neoplasm of colon: Secondary | ICD-10-CM

## 2021-12-10 MED ORDER — NA SULFATE-K SULFATE-MG SULF 17.5-3.13-1.6 GM/177ML PO SOLN: 1.0000 | Freq: Once | ORAL | 0 refills | Status: AC

## 2021-12-10 NOTE — Progress Notes (Signed)
Gastroenterology Pre-Procedure Review  Request Date: 12/27/2021 Requesting Physician: Dr. Allen Norris  PATIENT REVIEW QUESTIONS: The patient responded to the following health history questions as indicated:    1. Are you having any GI issues?  Some constipation but does not need to see provider first. 2. Do you have a personal history of Polyps? no 3. Do you have a family history of Colon Cancer or Polyps? yes (father- polyps) 4. Diabetes Mellitus? no 5. Joint replacements in the past 12 months?no 6. Major health problems in the past 3 months?no 7. Any artificial heart valves, MVP, or defibrillator?no    MEDICATIONS & ALLERGIES:    Patient reports the following regarding taking any anticoagulation/antiplatelet therapy:   Plavix, Coumadin, Eliquis, Xarelto, Lovenox, Pradaxa, Brilinta, or Effient? no Aspirin? no  Patient confirms/reports the following medications:  Current Outpatient Medications  Medication Sig Dispense Refill   buPROPion (WELLBUTRIN XL) 150 MG 24 hr tablet Take 3 tablets (450 mg total) by mouth daily. 90 tablet 11   Cholecalciferol (VITAMIN D3) 50 MCG (2000 UT) capsule Take 1 capsule (2,000 Units total) by mouth daily.     [START ON 02/03/2022] dexmethylphenidate (FOCALIN) 10 MG tablet Take 1 tablet (10 mg total) by mouth 2 (two) times daily. 60 tablet 0   [START ON 01/03/2022] dexmethylphenidate (FOCALIN) 10 MG tablet Take 1 tablet (10 mg total) by mouth 2 (two) times daily. 60 tablet 0   dexmethylphenidate (FOCALIN) 10 MG tablet Take 1 tablet (10 mg total) by mouth 2 (two) times daily. 60 tablet 0   escitalopram (LEXAPRO) 20 MG tablet Take 1 tablet (20 mg total) by mouth daily. 90 tablet 3   lisinopril (ZESTRIL) 10 MG tablet Take 1 tablet (10 mg total) by mouth daily. 90 tablet 3   No current facility-administered medications for this visit.    Patient confirms/reports the following allergies:  No Known Allergies  No orders of the defined types were placed in this  encounter.   AUTHORIZATION INFORMATION Primary Insurance: 1D#: Group #:  Secondary Insurance: 1D#: Group #:  SCHEDULE INFORMATION: Date: 12/27/2021  Time: Location: [place]

## 2021-12-16 ENCOUNTER — Encounter: Payer: Self-pay | Admitting: Family Medicine

## 2021-12-24 ENCOUNTER — Encounter: Payer: Self-pay | Admitting: Gastroenterology

## 2021-12-24 ENCOUNTER — Other Ambulatory Visit: Payer: Self-pay

## 2021-12-27 ENCOUNTER — Encounter
Admission: RE | Disposition: A | Discharge status: Home / Self Care | Payer: Self-pay | Source: Home / Self Care | Attending: Gastroenterology

## 2021-12-27 ENCOUNTER — Other Ambulatory Visit: Payer: Self-pay

## 2021-12-27 ENCOUNTER — Ambulatory Visit: Payer: Managed Care, Other (non HMO) | Admitting: Anesthesiology

## 2021-12-27 ENCOUNTER — Encounter: Payer: Self-pay | Admitting: Gastroenterology

## 2021-12-27 ENCOUNTER — Ambulatory Visit
Admission: RE | Admit: 2021-12-27 | Discharge: 2021-12-27 | Disposition: A | Discharge status: Home / Self Care | Payer: Managed Care, Other (non HMO) | Attending: Gastroenterology | Admitting: Gastroenterology

## 2021-12-27 DIAGNOSIS — K219 Gastro-esophageal reflux disease without esophagitis: Secondary | ICD-10-CM | POA: Insufficient documentation

## 2021-12-27 DIAGNOSIS — K640 First degree hemorrhoids: Secondary | ICD-10-CM | POA: Diagnosis not present

## 2021-12-27 DIAGNOSIS — D122 Benign neoplasm of ascending colon: Secondary | ICD-10-CM | POA: Diagnosis not present

## 2021-12-27 DIAGNOSIS — F32A Depression, unspecified: Secondary | ICD-10-CM | POA: Diagnosis not present

## 2021-12-27 DIAGNOSIS — Z1211 Encounter for screening for malignant neoplasm of colon: Secondary | ICD-10-CM | POA: Insufficient documentation

## 2021-12-27 DIAGNOSIS — E785 Hyperlipidemia, unspecified: Secondary | ICD-10-CM | POA: Insufficient documentation

## 2021-12-27 DIAGNOSIS — I10 Essential (primary) hypertension: Secondary | ICD-10-CM | POA: Diagnosis not present

## 2021-12-27 DIAGNOSIS — K635 Polyp of colon: Secondary | ICD-10-CM

## 2021-12-27 HISTORY — PX: COLONOSCOPY WITH PROPOFOL: SHX5780

## 2021-12-27 HISTORY — PX: POLYPECTOMY: SHX5525

## 2021-12-27 SURGERY — COLONOSCOPY WITH PROPOFOL
Anesthesia: General | Site: Rectum

## 2021-12-27 MED ORDER — PROPOFOL 10 MG/ML IV BOLUS
INTRAVENOUS | Status: DC | PRN
  Administered 2021-12-27 (×3): 30 mg via INTRAVENOUS
  Administered 2021-12-27: 100 mg via INTRAVENOUS
  Administered 2021-12-27 (×4): 30 mg via INTRAVENOUS

## 2021-12-27 MED ORDER — STERILE WATER FOR IRRIGATION IR SOLN
Status: DC | PRN
  Administered 2021-12-27: 1

## 2021-12-27 MED ORDER — LACTATED RINGERS IV SOLN: INTRAVENOUS | Status: DC

## 2021-12-27 MED ORDER — ACETAMINOPHEN 325 MG PO TABS: 325.0000 mg | ORAL_TABLET | Freq: Once | ORAL | Status: DC

## 2021-12-27 MED ORDER — LIDOCAINE HCL (CARDIAC) PF 100 MG/5ML IV SOSY
PREFILLED_SYRINGE | INTRAVENOUS | Status: DC | PRN
  Administered 2021-12-27: 30 mg via INTRAVENOUS

## 2021-12-27 MED ORDER — ACETAMINOPHEN 160 MG/5ML PO SOLN: 325.0000 mg | Freq: Once | ORAL | Status: DC

## 2021-12-27 MED ORDER — SODIUM CHLORIDE 0.9 % IV SOLN: INTRAVENOUS | Status: DC

## 2021-12-27 SURGICAL SUPPLY — 22 items

## 2021-12-27 NOTE — Op Note (Signed)
[facility] Patient Name: Teresa Villarreal Procedure Date: 12/27/2021 10:32 AM MRN: [id number] Account #: [id number] Date of Birth: 11/02/1971 Admit Type: Outpatient Age: 51 Room: [facility] OR ROOM 01 Gender: Female Note Status: Finalized Instrument Name: [id number] Procedure:             Colonoscopy Indications:           Screening for colorectal malignant neoplasm Providers:             Lucilla Lame MD, MD Referring MD:          Ria Bush (Referring MD) Medicines:             Propofol per Anesthesia Complications:         No immediate complications. Procedure:             Pre-Anesthesia Assessment:                        - Prior to the procedure, a History and Physical was                         performed, and patient medications and allergies were                         reviewed. The patient's tolerance of previous                         anesthesia was also reviewed. The risks and benefits                         of the procedure and the sedation options and risks                         were discussed with the patient. All questions were                         answered, and informed consent was obtained. Prior                         Anticoagulants: The patient has taken no previous                         anticoagulant or antiplatelet agents. ASA Grade                         Assessment: II - A patient with mild systemic disease.                         After reviewing the risks and benefits, the patient                         was deemed in satisfactory condition to undergo the                         procedure.                        After obtaining informed consent, the colonoscope was  passed under direct vision. Throughout the procedure,                         the patient's blood pressure, pulse, and oxygen                         saturations were monitored continuously. The                         Colonoscope was  introduced through the anus and                         advanced to the the cecum, identified by appendiceal                         orifice and ileocecal valve. The colonoscopy was                         performed without difficulty. The patient tolerated                         the procedure well. The quality of the bowel                         preparation was excellent. Findings:      The perianal and digital rectal examinations were normal.      A 5 mm polyp was found in the ascending colon. The polyp was sessile.       The polyp was removed with a cold snare. Resection and retrieval were       complete.      Non-bleeding internal hemorrhoids were found during retroflexion. The       hemorrhoids were Grade I (internal hemorrhoids that do not prolapse). Impression:            - One 5 mm polyp in the ascending colon, removed with                         a cold snare. Resected and retrieved.                        - Non-bleeding internal hemorrhoids. Recommendation:        - Discharge patient to home.                        - Resume previous diet.                        - Continue present medications.                        - Await pathology results.                        - Repeat colonoscopy in 5 years for surveillance. Procedure Code(s):     --- Professional ---                        [phone], Colonoscopy, flexible; with removal of  tumor(s), polyp(s), or other lesion(s) by snare                         technique Diagnosis Code(s):     --- Professional ---                        Z12.11, Encounter for screening for malignant neoplasm                         of colon                        K63.5, Polyp of colon CPT copyright 2019 American Medical Association. All rights reserved. The codes documented in this report are preliminary and upon coder review may  be revised to meet current compliance requirements. Lucilla Lame MD, MD 12/27/2021 10:50:21 AM This  report has been signed electronically. Number of Addenda: 0 Note Initiated On: 12/27/2021 10:32 AM Scope Withdrawal Time: 0 hours 7 minutes 49 seconds  Total Procedure Duration: 0 hours 12 minutes 39 seconds  Estimated Blood Loss:  Estimated blood loss: none.      [facility]

## 2021-12-27 NOTE — Anesthesia Postprocedure Evaluation (Signed)
Anesthesia Post Note  Patient: Teresa Villarreal  Procedure(s) Performed: COLONOSCOPY WITH PROPOFOL (Rectum) POLYPECTOMY (Rectum)     Patient location during evaluation: PACU Anesthesia Type: General Level of consciousness: awake and alert and oriented Pain management: satisfactory to patient Vital Signs Assessment: post-procedure vital signs reviewed and stable Respiratory status: spontaneous breathing, nonlabored ventilation and respiratory function stable Cardiovascular status: blood pressure returned to baseline and stable Postop Assessment: Adequate PO intake and No signs of nausea or vomiting Anesthetic complications: no   No notable events documented.  Raliegh Ip

## 2021-12-27 NOTE — H&P (Signed)
Teresa Lame, MD [facility] [address], Teresa Villarreal, [place] [address] Phone: [phone] Fax : [phone]  Primary Care Physician:  Teresa Bush, MD Primary Gastroenterologist:  Dr. Allen Norris  Pre-Procedure History & Physical: HPI:  Teresa Villarreal is a 51 y.o. female is here for a screening colonoscopy.   Past Medical History:  Diagnosis Date   Chicken pox    Depression    Frequent headaches    stress, approx 2x/wk   Genital warts    History of kidney stones    Hyperlipidemia    Hypertension    Motion sickness    amusement park rides   PONV (postoperative nausea and vomiting)     Past Surgical History:  Procedure Laterality Date   CYSTOSCOPY  2010   ESOPHAGOGASTRODUODENOSCOPY (EGD) WITH PROPOFOL N/A 11/22/2018   Procedure: ESOPHAGOGASTRODUODENOSCOPY (EGD) WITH PROPOFOL;  Surgeon: Teresa Lame, MD;  Location: [place];  Service: Endoscopy;  Laterality: N/A;   HYSTEROSCOPY  2010   KNEE ARTHROSCOPY Left 1990    Prior to Admission medications   Medication Sig Start Date End Date Taking? Authorizing Provider  buPROPion (WELLBUTRIN XL) 150 MG 24 hr tablet Take 3 tablets (450 mg total) by mouth daily. 12/04/21  Yes Teresa Bush, MD  Cholecalciferol (VITAMIN D3) 50 MCG (2000 UT) capsule Take 1 capsule (2,000 Units total) by mouth daily. 12/03/21  Yes Teresa Bush, MD  dexmethylphenidate (FOCALIN) 10 MG tablet Take 1 tablet (10 mg total) by mouth 2 (two) times daily. 02/03/22  Yes Teresa Bush, MD  dexmethylphenidate (FOCALIN) 10 MG tablet Take 1 tablet (10 mg total) by mouth 2 (two) times daily. 01/03/22  Yes Teresa Bush, MD  dexmethylphenidate (FOCALIN) 10 MG tablet Take 1 tablet (10 mg total) by mouth 2 (two) times daily. 12/03/21  Yes Teresa Bush, MD  escitalopram (LEXAPRO) 20 MG tablet Take 1 tablet (20 mg total) by mouth daily. 12/03/21  Yes Teresa Bush, MD  lisinopril (ZESTRIL) 10 MG tablet Take 1 tablet (10 mg total) by mouth  daily. 12/03/21  Yes Teresa Bush, MD    Allergies as of 12/10/2021   (No Known Allergies)    Family History  Problem Relation Age of Onset   Hyperlipidemia Mother    Heart disease Mother    Hypertension Mother    Alzheimer's disease Mother    COPD Mother    Hyperlipidemia Father    Hypertension Father    Mitochondrial disorder Brother        Mitochondrial encephalomyopathy lactic acidosis   Breast cancer Paternal Aunt    Arthritis Maternal Grandmother     Social History   Socioeconomic History   Marital status: Married    Spouse name: Not on file   Number of children: Not on file   Years of education: Not on file   Highest education level: Not on file  Occupational History   Not on file  Tobacco Use   Smoking status: Never   Smokeless tobacco: Never  Vaping Use   Vaping Use: Never used  Substance and Sexual Activity   Alcohol use: Yes    Alcohol/week: 0.0 standard drinks    Comment: occasional - 1x/mo   Drug use: No   Sexual activity: Not on file  Other Topics Concern   Not on file  Social History Narrative   Lives with husband 3 children    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    Diet: some water, fruits/vegetables  some    Social Determinants of Radio broadcast assistant Strain: Not on file  Food Insecurity: Not on file  Transportation Needs: Not on file  Physical Activity: Not on file  Stress: Not on file  Social Connections: Not on file  Intimate Partner Violence: Not on file    Review of Systems: See HPI, otherwise negative ROS  Physical Exam: BP (!) 136/98    Pulse 95    Temp 97.9 F (36.6 C) (Tympanic)    Resp 20    Ht 5\' 5"  (1.651 m)    Wt 85.3 kg    SpO2 96%    BMI 31.28 kg/m  General:   Alert,  pleasant and cooperative in NAD Head:  Normocephalic and atraumatic. Neck:  Supple; no masses or thyromegaly. Lungs:  Clear throughout to auscultation.    Heart:  Regular rate and  rhythm. Abdomen:  Soft, nontender and nondistended. Normal bowel sounds, without guarding, and without rebound.   Neurologic:  Alert and  oriented x4;  grossly normal neurologically.  Impression/Plan: Teresa Villarreal is now here to undergo a screening colonoscopy.  Risks, benefits, and alternatives regarding colonoscopy have been reviewed with the patient.  Questions have been answered.  All parties agreeable.

## 2021-12-27 NOTE — Transfer of Care (Signed)
Immediate Anesthesia Transfer of Care Note  Patient: Teresa Villarreal  Procedure(s) Performed: COLONOSCOPY WITH PROPOFOL (Rectum) POLYPECTOMY (Rectum)  Patient Location: PACU  Anesthesia Type: General  Level of Consciousness: awake, alert  and patient cooperative  Airway and Oxygen Therapy: Patient Spontanous Breathing and Patient connected to supplemental oxygen  Post-op Assessment: Post-op Vital signs reviewed, Patient's Cardiovascular Status Stable, Respiratory Function Stable, Patent Airway and No signs of Nausea or vomiting  Post-op Vital Signs: Reviewed and stable  Complications: No notable events documented.

## 2021-12-27 NOTE — Anesthesia Preprocedure Evaluation (Signed)
Anesthesia Evaluation  Patient identified by MRN, date of birth, ID band Patient awake    Reviewed: Allergy & Precautions, H&P , NPO status , Patient's Chart, lab work & pertinent test results  History of Anesthesia Complications (+) PONV  Airway Mallampati: II  TM Distance: >3 FB Neck ROM: full    Dental no notable dental hx.    Pulmonary    Pulmonary exam normal breath sounds clear to auscultation       Cardiovascular hypertension, Normal cardiovascular exam Rhythm:regular Rate:Normal  HLD   Neuro/Psych Depression    GI/Hepatic GERD  ,  Endo/Other    Renal/GU      Musculoskeletal   Abdominal   Peds  Hematology   Anesthesia Other Findings   Reproductive/Obstetrics                             Anesthesia Physical Anesthesia Plan  ASA: 2  Anesthesia Plan: General   Post-op Pain Management: Minimal or no pain anticipated   Induction: Intravenous  PONV Risk Score and Plan: 4 or greater and Treatment may vary due to age or medical condition, TIVA and Propofol infusion  Airway Management Planned: Natural Airway  Additional Equipment:   Intra-op Plan:   Post-operative Plan:   Informed Consent: I have reviewed the patients History and Physical, chart, labs and discussed the procedure including the risks, benefits and alternatives for the proposed anesthesia with the patient or authorized representative who has indicated his/her understanding and acceptance.     Dental Advisory Given  Plan Discussed with: CRNA  Anesthesia Plan Comments:         Anesthesia Quick Evaluation

## 2021-12-28 ENCOUNTER — Encounter: Payer: Self-pay | Admitting: Gastroenterology

## 2021-12-28 LAB — SURGICAL PATHOLOGY

## 2022-01-02 ENCOUNTER — Other Ambulatory Visit: Payer: Self-pay | Admitting: Family Medicine

## 2022-01-03 NOTE — Telephone Encounter (Signed)
ERx 

## 2022-02-21 ENCOUNTER — Encounter: Payer: Self-pay | Admitting: Family Medicine

## 2022-02-24 ENCOUNTER — Telehealth: Payer: Self-pay | Admitting: Family Medicine

## 2022-02-24 NOTE — Telephone Encounter (Signed)
Pt called stating that her medication for depression is not working anymore. Pt is asking if you can prescribe something else. Please advise. ?

## 2022-02-25 MED ORDER — BUPROPION HCL ER (XL) 150 MG PO TB24: 300.0000 mg | ORAL_TABLET | Freq: Every day | ORAL | 2 refills | Status: DC

## 2022-02-25 MED ORDER — ARIPIPRAZOLE 2 MG PO TABS: 2.0000 mg | ORAL_TABLET | Freq: Every day | ORAL | 3 refills | Status: DC

## 2022-02-25 NOTE — Telephone Encounter (Signed)
See Pt Msg, 02/21/22. ?

## 2022-02-25 NOTE — Telephone Encounter (Addendum)
I'm glad she's seeing counselor next week.  ?Recommend dropping wellbutrin XL back to 300 mg daily as she didn't note benefit on '450mg'$  dose. Continue lexapro '20mg'$ , would suggest adding abilify adjuvant mood medicine which I've sent to her pharmacy. Would also suggest referral to psychiatry for their recommendations given she was on 2 full strength meds without significant improvement.  ?

## 2022-02-25 NOTE — Addendum Note (Signed)
Addended by: Ria Bush on: 02/25/2022 01:56 PM ? ? Modules accepted: Orders ? ?

## 2022-02-25 NOTE — Telephone Encounter (Addendum)
Lvm asking pt to call back.  Need to relay Dr. Synthia Innocent message.  ? ?Also, sent info in a MyChart message.  ?

## 2022-03-11 ENCOUNTER — Other Ambulatory Visit: Payer: Self-pay | Admitting: Family Medicine

## 2022-03-11 MED ORDER — DEXMETHYLPHENIDATE HCL 10 MG PO TABS: 10.0000 mg | ORAL_TABLET | Freq: Two times a day (BID) | ORAL | 0 refills | Status: DC

## 2022-03-11 NOTE — Addendum Note (Signed)
Addended by: Ria Bush on: 03/11/2022 11:13 AM ? ? Modules accepted: Orders ? ?

## 2022-03-11 NOTE — Telephone Encounter (Signed)
Duplicate request

## 2022-03-11 NOTE — Telephone Encounter (Signed)
This was already refilled.

## 2022-03-17 DIAGNOSIS — H02831 Dermatochalasis of right upper eyelid: Secondary | ICD-10-CM | POA: Insufficient documentation

## 2022-03-17 DIAGNOSIS — H57813 Brow ptosis, bilateral: Secondary | ICD-10-CM | POA: Insufficient documentation

## 2022-03-17 DIAGNOSIS — H02132 Senile ectropion of right lower eyelid: Secondary | ICD-10-CM | POA: Insufficient documentation

## 2022-03-17 DIAGNOSIS — H16213 Exposure keratoconjunctivitis, bilateral: Secondary | ICD-10-CM | POA: Insufficient documentation

## 2022-03-18 ENCOUNTER — Other Ambulatory Visit: Payer: Self-pay | Admitting: Family Medicine

## 2022-04-11 ENCOUNTER — Other Ambulatory Visit: Payer: Self-pay | Admitting: Family Medicine

## 2022-04-11 ENCOUNTER — Encounter: Payer: Self-pay | Admitting: Family Medicine

## 2022-04-11 MED ORDER — DEXMETHYLPHENIDATE HCL 10 MG PO TABS: 10.0000 mg | ORAL_TABLET | Freq: Two times a day (BID) | ORAL | 0 refills | Status: DC

## 2022-04-11 NOTE — Telephone Encounter (Signed)
Last refill 03/11/22 #60 ?Last office visit 12/03/21 ?No upcoming appointment scheduled ?

## 2022-04-11 NOTE — Telephone Encounter (Signed)
?  Encourage patient to contact the pharmacy for refills or they can request refills through [facility] ? ?Did the patient contact the pharmacy:  N ? ? ?LAST APPOINTMENT DATE:  12.23.22 ? ?NEXT APPOINTMENT DATE: Not scheduled yet ? ?MEDICATION: dexmethylphenidate (FOCALIN) 10 MG tablet ? ?Is the patient out of medication? N ? ?If not, how much is left? (One day left) ? ?Is this a 90 day supply: N ? ?PHARMACY:  [id number] [place]:  [phone] ?Fax:  [phone] ?  ? ? ?Let patient know to contact pharmacy at the end of the day to make sure medication is ready. ? ?Please notify patient to allow 48-72 hours to process ?  ?

## 2022-04-11 NOTE — Telephone Encounter (Signed)
ERx 

## 2022-04-25 NOTE — Telephone Encounter (Signed)
Error

## 2022-05-10 ENCOUNTER — Other Ambulatory Visit: Payer: Self-pay | Admitting: Family Medicine

## 2022-05-10 ENCOUNTER — Telehealth: Payer: Self-pay | Admitting: Family Medicine

## 2022-05-10 MED ORDER — DEXMETHYLPHENIDATE HCL 10 MG PO TABS: 10.0000 mg | ORAL_TABLET | Freq: Two times a day (BID) | ORAL | 0 refills | Status: DC

## 2022-05-10 NOTE — Telephone Encounter (Signed)
I've refilled focalin. Plz touch base with patient - last we talked via mychart plan was for her to establish with psychiatrist - did she do this? Any med changes? Are we still doing focalin Rx?

## 2022-05-10 NOTE — Telephone Encounter (Signed)
Encourage patient to contact the pharmacy for refills or they can request refills through [facility]  Did the patient contact the pharmacy: N/A   LAST APPOINTMENT DATE:  12/03/2021  MEDICATION: dexmethylphenidate (FOCALIN) 10 MG tablet  Is the patient out of medication? Almost out  If not, how much is left? 3/4 left  Is this a 90 day supply: No  [facility]ACY[facility]  [id number] [place]: [phone] Let patient know to contact pharmacy at the end of the day to make sure medication is ready.  Please notify patient to allow 48-72 hours to process

## 2022-05-10 NOTE — Telephone Encounter (Signed)
Encourage patient to contact the pharmacy for refills or they can request refills through [facility]  Did the patient contact the pharmacy: No  LAST APPOINTMENT DATE:  12/03/2021  MEDICATION:  buPROPion (WELLBUTRIN XL) 150 MG 24 hr tablet  Is the patient out of medication? Yes  If not, how much is left? N/A  Is this a 90 day supply: No (300 mg not the 150 mg)  [id number] [place]: [phone] Let patient know to contact pharmacy at the end of the day to make sure medication is ready.  Please notify patient to allow 48-72 hours to process

## 2022-05-10 NOTE — Telephone Encounter (Signed)
Last office visit 12/03/21 No upcoming appointment scheduled Last refill 04/11/22 #60

## 2022-05-11 NOTE — Telephone Encounter (Signed)
According to pt's chart, last rx was sent 02/25/22, #180/2.  Pt should have refills available.  Spoke with pt relaying info above.  She verbalizes understanding and will contact the pharmacy to have it filled.

## 2022-05-11 NOTE — Telephone Encounter (Signed)
Spoke with pt notifying her Focalin refill sent.  States she has not established with psychiatry yet but has been seen by therapist.  Says she forgot about psychiatry but will find someone. Pt has not had any med changes and is doing good with Focalin.

## 2022-05-30 ENCOUNTER — Other Ambulatory Visit: Payer: Self-pay | Admitting: Family Medicine

## 2022-05-30 DIAGNOSIS — Z1231 Encounter for screening mammogram for malignant neoplasm of breast: Secondary | ICD-10-CM

## 2022-06-01 ENCOUNTER — Ambulatory Visit
Admission: RE | Admit: 2022-06-01 | Discharge: 2022-06-01 | Disposition: A | Discharge status: Home / Self Care | Payer: Managed Care, Other (non HMO) | Source: Ambulatory Visit

## 2022-06-01 DIAGNOSIS — Z1231 Encounter for screening mammogram for malignant neoplasm of breast: Secondary | ICD-10-CM

## 2022-06-02 ENCOUNTER — Encounter: Payer: Self-pay | Admitting: Family Medicine

## 2022-06-07 ENCOUNTER — Ambulatory Visit (INDEPENDENT_AMBULATORY_CARE_PROVIDER_SITE_OTHER): Payer: Managed Care, Other (non HMO)

## 2022-06-07 DIAGNOSIS — Z23 Encounter for immunization: Secondary | ICD-10-CM | POA: Diagnosis not present

## 2022-08-03 ENCOUNTER — Other Ambulatory Visit: Payer: Self-pay | Admitting: Family Medicine

## 2022-08-03 NOTE — Telephone Encounter (Signed)
Refill request Lexapro Last refill 12/03/21 #90/3 Last office visit 12/03/21 No upcoming appointment scheduled

## 2022-11-13 ENCOUNTER — Telehealth: Payer: Self-pay | Admitting: Family Medicine

## 2022-11-13 DIAGNOSIS — R748 Abnormal levels of other serum enzymes: Secondary | ICD-10-CM

## 2022-11-13 DIAGNOSIS — E7800 Pure hypercholesterolemia, unspecified: Secondary | ICD-10-CM

## 2022-11-13 DIAGNOSIS — E611 Iron deficiency: Secondary | ICD-10-CM

## 2022-11-13 DIAGNOSIS — Z1159 Encounter for screening for other viral diseases: Secondary | ICD-10-CM

## 2022-11-13 DIAGNOSIS — E559 Vitamin D deficiency, unspecified: Secondary | ICD-10-CM

## 2022-11-13 DIAGNOSIS — I10 Essential (primary) hypertension: Secondary | ICD-10-CM

## 2022-11-14 ENCOUNTER — Telehealth: Payer: Self-pay | Admitting: Family Medicine

## 2022-11-14 NOTE — Telephone Encounter (Signed)
error 

## 2022-11-14 NOTE — Telephone Encounter (Signed)
E-scribed refill.  Plz schedule CPE and lab visits for additional refills.  

## 2022-11-14 NOTE — Telephone Encounter (Signed)
Labs ordered.

## 2022-11-14 NOTE — Addendum Note (Signed)
Addended by: Ria Bush on: 11/14/2022 04:52 PM   Modules accepted: Orders

## 2022-11-14 NOTE — Telephone Encounter (Signed)
Spoke to pt, scheduled cpe for 01/30/23. Pt requested for orders to be sent to [facility] for fasting labs? Call back # [phone]

## 2022-11-15 NOTE — Telephone Encounter (Signed)
Spoke with pt notifying her labs have been ordered. Expresses her thanks.

## 2023-01-24 LAB — CBC WITH DIFFERENTIAL/PLATELET
Basophils Absolute: 0.1 10*3/uL (ref 0.0–0.2)
Basos: 1 %
EOS (ABSOLUTE): 0.2 10*3/uL (ref 0.0–0.4)
Eos: 5 %
Hematocrit: 36.7 % (ref 34.0–46.6)
Hemoglobin: 12.3 g/dL (ref 11.1–15.9)
Immature Grans (Abs): 0 10*3/uL (ref 0.0–0.1)
Immature Granulocytes: 0 %
Lymphocytes Absolute: 1.5 10*3/uL (ref 0.7–3.1)
Lymphs: 30 %
MCH: 31.7 pg (ref 26.6–33.0)
MCHC: 33.5 g/dL (ref 31.5–35.7)
MCV: 95 fL (ref 79–97)
Monocytes Absolute: 0.3 10*3/uL (ref 0.1–0.9)
Monocytes: 6 %
Neutrophils Absolute: 3 10*3/uL (ref 1.4–7.0)
Neutrophils: 58 %
Platelets: 279 10*3/uL (ref 150–450)
RBC: 3.88 x10E6/uL (ref 3.77–5.28)
RDW: 12.6 % (ref 11.7–15.4)
WBC: 5.1 10*3/uL (ref 3.4–10.8)

## 2023-01-24 LAB — HEPATITIS C ANTIBODY: Hep C Virus Ab: NONREACTIVE

## 2023-01-24 LAB — IRON,TIBC AND FERRITIN PANEL
Ferritin: 90 ng/mL (ref 15–150)
Iron Saturation: 29 % (ref 15–55)
Iron: 92 ug/dL (ref 27–159)
Total Iron Binding Capacity: 316 ug/dL (ref 250–450)
UIBC: 224 ug/dL (ref 131–425)

## 2023-01-24 LAB — COMPREHENSIVE METABOLIC PANEL
ALT: 39 IU/L — ABNORMAL HIGH (ref 0–32)
AST: 27 IU/L (ref 0–40)
Albumin/Globulin Ratio: 2 (ref 1.2–2.2)
Albumin: 4.3 g/dL (ref 3.8–4.9)
Alkaline Phosphatase: 116 IU/L (ref 44–121)
BUN/Creatinine Ratio: 19 (ref 9–23)
BUN: 16 mg/dL (ref 6–24)
Bilirubin Total: 0.4 mg/dL (ref 0.0–1.2)
CO2: 21 mmol/L (ref 20–29)
Calcium: 9.1 mg/dL (ref 8.7–10.2)
Chloride: 106 mmol/L (ref 96–106)
Creatinine, Ser: 0.84 mg/dL (ref 0.57–1.00)
Globulin, Total: 2.1 g/dL (ref 1.5–4.5)
Glucose: 94 mg/dL (ref 70–99)
Potassium: 4.3 mmol/L (ref 3.5–5.2)
Sodium: 141 mmol/L (ref 134–144)
Total Protein: 6.4 g/dL (ref 6.0–8.5)
eGFR: 84 mL/min/{1.73_m2} (ref >59)

## 2023-01-24 LAB — LIPID PANEL
Chol/HDL Ratio: 6.7 ratio — ABNORMAL HIGH (ref 0.0–4.4)
Cholesterol, Total: 248 mg/dL — ABNORMAL HIGH (ref 100–199)
HDL: 37 mg/dL — ABNORMAL LOW (ref >39)
LDL Chol Calc (NIH): 164 mg/dL — ABNORMAL HIGH (ref 0–99)
Triglycerides: 249 mg/dL — ABNORMAL HIGH (ref 0–149)
VLDL Cholesterol Cal: 47 mg/dL — ABNORMAL HIGH (ref 5–40)

## 2023-01-24 LAB — APOLIPOPROTEIN B: Apolipoprotein B: 150 mg/dL — ABNORMAL HIGH (ref <90)

## 2023-01-24 LAB — LIPOPROTEIN A (LPA): Lipoprotein (a): 100.9 nmol/L — ABNORMAL HIGH (ref <75.0)

## 2023-01-24 LAB — VITAMIN D 25 HYDROXY (VIT D DEFICIENCY, FRACTURES): Vit D, 25-Hydroxy: 20.2 ng/mL — ABNORMAL LOW (ref 30.0–100.0)

## 2023-01-27 ENCOUNTER — Encounter: Payer: Self-pay | Admitting: Family Medicine

## 2023-01-30 ENCOUNTER — Ambulatory Visit (INDEPENDENT_AMBULATORY_CARE_PROVIDER_SITE_OTHER): Payer: Managed Care, Other (non HMO) | Admitting: Family Medicine

## 2023-01-30 ENCOUNTER — Encounter: Payer: Self-pay | Admitting: Family Medicine

## 2023-01-30 VITALS — BP 132/84 | HR 85 | Temp 97.4°F | Ht 64.5 in | Wt 202.5 lb

## 2023-01-30 DIAGNOSIS — E559 Vitamin D deficiency, unspecified: Secondary | ICD-10-CM

## 2023-01-30 DIAGNOSIS — E669 Obesity, unspecified: Secondary | ICD-10-CM

## 2023-01-30 DIAGNOSIS — J019 Acute sinusitis, unspecified: Secondary | ICD-10-CM

## 2023-01-30 DIAGNOSIS — F900 Attention-deficit hyperactivity disorder, predominantly inattentive type: Secondary | ICD-10-CM | POA: Diagnosis not present

## 2023-01-30 DIAGNOSIS — R7401 Elevation of levels of liver transaminase levels: Secondary | ICD-10-CM

## 2023-01-30 DIAGNOSIS — I10 Essential (primary) hypertension: Secondary | ICD-10-CM | POA: Diagnosis not present

## 2023-01-30 DIAGNOSIS — E7841 Elevated Lipoprotein(a): Secondary | ICD-10-CM | POA: Diagnosis not present

## 2023-01-30 DIAGNOSIS — F331 Major depressive disorder, recurrent, moderate: Secondary | ICD-10-CM

## 2023-01-30 DIAGNOSIS — Z0000 Encounter for general adult medical examination without abnormal findings: Secondary | ICD-10-CM | POA: Diagnosis not present

## 2023-01-30 DIAGNOSIS — E611 Iron deficiency: Secondary | ICD-10-CM

## 2023-01-30 DIAGNOSIS — N951 Menopausal and female climacteric states: Secondary | ICD-10-CM

## 2023-01-30 MED ORDER — ATORVASTATIN CALCIUM 20 MG PO TABS: 20.0000 mg | ORAL_TABLET | Freq: Every day | ORAL | 3 refills | Status: DC

## 2023-01-30 MED ORDER — AMOXICILLIN-POT CLAVULANATE 875-125 MG PO TABS: 1.0000 | ORAL_TABLET | Freq: Two times a day (BID) | ORAL | 0 refills | Status: AC

## 2023-01-30 MED ORDER — LISINOPRIL 10 MG PO TABS: 10.0000 mg | ORAL_TABLET | Freq: Every day | ORAL | 4 refills | Status: DC

## 2023-01-30 NOTE — Progress Notes (Signed)
Patient ID: Teresa Villarreal, female    DOB: 28-Jun-1971, 52 y.o.   MRN: [id number]  This visit was conducted in person.  BP 132/84   Pulse 85   Temp (!) 97.4 F (36.3 C) (Temporal)   Ht 5' 4.5" (1.638 m)   Wt 202 lb 8 oz (91.9 kg)   SpO2 98%   BMI 34.22 kg/m    CC: CPE Subjective:   HPI: Teresa Villarreal is a 52 y.o. female presenting on 01/30/2023 for Annual Exam   Mom passed away 11/28/2022. Father passed away earlier in the year as well. Work stress - [place].   Recent dental work - crown placed to R upper tooth 01/20/2023 - since then notes marked pressure to R face as well as purulent dark green drainage when blowing nose after hot shower. Taking ibuprofen 400-823m 2-3 times/day.   ADHD dx 2016 by psychological testing (Dr PRexene Edison now managed by psychiatrist - on focalin XR 160min am and focalin 1050mID (at 12:30pm and 4:30pm) - [place] [place] on lexapro 39m52mlso on wellbutrin XL (thinks 300mg59mly).    Preventative: COLONOSCOPY WITH PROPOFOL 12/27/2021 - [facility], int hem, rpt 5 yrs ([facility] Norrisren, MD) Well woman exam - last pap normal 05/2020 - h/o HPV and herpes. Rpt 3-5 yrs.  Mammogram 05/2022 - Birads1 @ [place] LMP 04/2022 - perimenopausal  DEXA scan - not due Lung cancer screening - not due Flu shot - declined COVID shot - Moderna 02/2020, 03/2020  Tap 2012, 12/202022/12/18monia shot - not due Shingrix - 12/202022-12-18023 Seat belt use discussed Sunscreen use discussed. No changing moles on skin. 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 Interim medical history since our last visit  reviewed. Allergies and medications reviewed and updated. Outpatient Medications Prior to Visit  Medication Sig Dispense Refill   dexmethylphenidate (FOCALIN XR) 15 MG 24 hr capsule Take 15 mg by mouth every morning.     dexmethylphenidate (FOCALIN) 10 MG tablet Take 1 tablet (10 mg total) by mouth 2 (two) times daily. 60 tablet 0   escitalopram (LEXAPRO) 20 MG tablet TAKE 1 TABLET BY MOUTH EVERY DAY 90 tablet 3   buPROPion (WELLBUTRIN XL) 150 MG 24 hr tablet Take 2 tablets (300 mg total) by mouth daily. 180 tablet 2   lisinopril (ZESTRIL) 10 MG tablet TAKE 1 TABLET BY MOUTH EVERY DAY 90 tablet 0   buPROPion (WELLBUTRIN XL) 300 MG 24 hr tablet Take 1 tablet (300 mg total) by mouth daily.     ARIPiprazole (ABILIFY) 2 MG tablet TAKE 1 TABLET BY MOUTH EVERY DAY 90 tablet 2   Cholecalciferol (VITAMIN D3) 50 MCG (2000 UT) capsule Take 1 capsule (2,000 Units total) by mouth daily.     dexmethylphenidate (FOCALIN) 10 MG tablet Take 1 tablet (10 mg total) by mouth 2 (two) times daily. 60 tablet 0   dexmethylphenidate (FOCALIN) 10 MG tablet Take 1 tablet (10 mg total) by mouth 2 (two) times daily. 60 tablet 0   No facility-administered medications prior to visit.     Per HPI unless specifically indicated in  ROS section below Review of Systems  Constitutional:  Negative for activity change, appetite change, chills, fatigue, fever and unexpected weight change.  HENT:  Positive for sinus pain (R sided). Negative for hearing loss.   Eyes:  Negative for visual disturbance.  Respiratory:  Negative for cough, chest tightness, shortness of breath and wheezing.   Cardiovascular:  Negative for chest pain, palpitations and leg swelling.  Gastrointestinal:  Negative for abdominal distention, abdominal pain, blood in stool, constipation, diarrhea, nausea and vomiting.       Upset stomach today  Genitourinary:  Negative for difficulty urinating and hematuria.  Musculoskeletal:  Negative for arthralgias,  myalgias and neck pain.  Skin:  Negative for rash.  Neurological:  Negative for dizziness, seizures, syncope and headaches.  Hematological:  Negative for adenopathy. Does not bruise/bleed easily.  Psychiatric/Behavioral:  Negative for dysphoric mood. The patient is not nervous/anxious.     Objective:  BP 132/84   Pulse 85   Temp (!) 97.4 F (36.3 C) (Temporal)   Ht 5' 4.5" (1.638 m)   Wt 202 lb 8 oz (91.9 kg)   SpO2 98%   BMI 34.22 kg/m   Wt Readings from Last 3 Encounters:  01/30/23 202 lb 8 oz (91.9 kg)  12/27/21 188 lb (85.3 kg)  12/03/21 194 lb 8 oz (88.2 kg)      Physical Exam Vitals and nursing note reviewed.  Constitutional:      Appearance: Normal appearance. She is not ill-appearing.  HENT:     Head: Normocephalic and atraumatic.     Right Ear: Tympanic membrane, ear canal and external ear normal. There is no impacted cerumen.     Left Ear: Tympanic membrane, ear canal and external ear normal. There is no impacted cerumen.     Nose: No mucosal edema, congestion or rhinorrhea.     Right Turbinates: Not enlarged or swollen.     Left Turbinates: Not enlarged or swollen.     Right Sinus: Maxillary sinus tenderness present. No frontal sinus tenderness.     Left Sinus: No maxillary sinus tenderness or frontal sinus tenderness.     Mouth/Throat:     Mouth: Mucous membranes are moist.     Pharynx: Oropharynx is clear. No oropharyngeal exudate or posterior oropharyngeal erythema.  Eyes:     General:        Right eye: No discharge.        Left eye: No discharge.     Extraocular Movements: Extraocular movements intact.     Conjunctiva/sclera: Conjunctivae normal.     Pupils: Pupils are equal, round, and reactive to light.  Neck:     Thyroid: No thyroid mass or thyromegaly.  Cardiovascular:     Rate and Rhythm: Normal rate and regular rhythm.     Pulses: Normal pulses.     Heart sounds: Normal heart sounds. No murmur heard. Pulmonary:     Effort: Pulmonary effort is  normal. No respiratory distress.     Breath sounds: Normal breath sounds. No wheezing, rhonchi or rales.  Abdominal:     General: Bowel sounds are normal. There is no distension.     Palpations: Abdomen is soft. There is no mass.     Tenderness: There is no abdominal tenderness. There is no guarding or rebound.     Hernia: No hernia is present.  Musculoskeletal:     Cervical back: Normal range of motion and neck supple. No rigidity.     Right lower leg: No edema.  Left lower leg: No edema.  Lymphadenopathy:     Head:     Right side of head: No submental, submandibular, tonsillar, preauricular or posterior auricular adenopathy.     Left side of head: No submental, submandibular, tonsillar, preauricular or posterior auricular adenopathy.     Cervical: No cervical adenopathy.     Right cervical: No superficial cervical adenopathy.    Left cervical: No superficial cervical adenopathy.     Upper Body:     Right upper body: No supraclavicular adenopathy.     Left upper body: No supraclavicular adenopathy.  Skin:    General: Skin is warm and dry.     Findings: No rash.  Neurological:     General: No focal deficit present.     Mental Status: She is alert. Mental status is at baseline.  Psychiatric:        Mood and Affect: Mood normal.        Behavior: Behavior normal.       Results for orders placed or performed in visit on 11/13/22  Iron, TIBC and Ferritin Panel  Result Value Ref Range   Total Iron Binding Capacity 316 250 - 450 ug/dL   UIBC 224 131 - 425 ug/dL   Iron 92 27 - 159 ug/dL   Iron Saturation 29 15 - 55 %   Ferritin 90 15 - 150 ng/mL  Apolipoprotein B  Result Value Ref Range   Apolipoprotein B 150 (H) <90 mg/dL  Lipoprotein A (LPA)  Result Value Ref Range   Lipoprotein (a) 100.9 (H) <75.0 nmol/L  Hepatitis C antibody  Result Value Ref Range   Hep C Virus Ab Non Reactive Non Reactive  CBC with Differential/Platelet  Result Value Ref Range   WBC 5.1 3.4 - 10.8  x10E3/uL   RBC 3.88 3.77 - 5.28 x10E6/uL   Hemoglobin 12.3 11.1 - 15.9 g/dL   Hematocrit 36.7 34.0 - 46.6 %   MCV 95 79 - 97 fL   MCH 31.7 26.6 - 33.0 pg   MCHC 33.5 31.5 - 35.7 g/dL   RDW 12.6 11.7 - 15.4 %   Platelets 279 150 - 450 x10E3/uL   Neutrophils 58 Not Estab. %   Lymphs 30 Not Estab. %   Monocytes 6 Not Estab. %   Eos 5 Not Estab. %   Basos 1 Not Estab. %   Neutrophils Absolute 3.0 1.4 - 7.0 x10E3/uL   Lymphocytes Absolute 1.5 0.7 - 3.1 x10E3/uL   Monocytes Absolute 0.3 0.1 - 0.9 x10E3/uL   EOS (ABSOLUTE) 0.2 0.0 - 0.4 x10E3/uL   Basophils Absolute 0.1 0.0 - 0.2 x10E3/uL   Immature Granulocytes 0 Not Estab. %   Immature Grans (Abs) 0.0 0.0 - 0.1 x10E3/uL  Comprehensive metabolic panel  Result Value Ref Range   Glucose 94 70 - 99 mg/dL   BUN 16 6 - 24 mg/dL   Creatinine, Ser 0.84 0.57 - 1.00 mg/dL   eGFR 84 >59 mL/min/1.73   BUN/Creatinine Ratio 19 9 - 23   Sodium 141 134 - 144 mmol/L   Potassium 4.3 3.5 - 5.2 mmol/L   Chloride 106 96 - 106 mmol/L   CO2 21 20 - 29 mmol/L   Calcium 9.1 8.7 - 10.2 mg/dL   Total Protein 6.4 6.0 - 8.5 g/dL   Albumin 4.3 3.8 - 4.9 g/dL   Globulin, Total 2.1 1.5 - 4.5 g/dL   Albumin/Globulin Ratio 2.0 1.2 - 2.2   Bilirubin Total 0.4 0.0 - 1.2 mg/dL  Alkaline Phosphatase 116 44 - 121 IU/L   AST 27 0 - 40 IU/L   ALT 39 (H) 0 - 32 IU/L  Lipid panel  Result Value Ref Range   Cholesterol, Total 248 (H) 100 - 199 mg/dL   Triglycerides 249 (H) 0 - 149 mg/dL   HDL 37 (L) >39 mg/dL   VLDL Cholesterol Cal 47 (H) 5 - 40 mg/dL   LDL Chol Calc (NIH) 164 (H) 0 - 99 mg/dL   Chol/HDL Ratio 6.7 (H) 0.0 - 4.4 ratio  VITAMIN D 25 Hydroxy (Vit-D Deficiency, Fractures)  Result Value Ref Range   Vit D, 25-Hydroxy 20.2 (L) 30.0 - 100.0 ng/mL      01/30/2023   12:36 PM 12/03/2021    1:21 PM 05/20/2020    8:32 AM 09/11/2018   10:39 AM 08/08/2018    9:38 AM  Depression screen PHQ 2/9  Decreased Interest 3 3 0 1 2  Down, Depressed, Hopeless 2 3 0 1  2  PHQ - 2 Score 5 6 0 2 4  Altered sleeping 2 3  0 3  Tired, decreased energy 3 3  1 3  $ Change in appetite 3 3  0 2  Feeling bad or failure about yourself  3 2  0 3  Trouble concentrating 3 2  1 2  $ Moving slowly or fidgety/restless 0 0  0 1  Suicidal thoughts 0 0  0 0  PHQ-9 Score 19 19  4 18  $ Difficult doing work/chores Very difficult   Not difficult at all Extremely dIfficult      01/30/2023   12:36 PM 12/03/2021    1:21 PM 09/11/2018   10:40 AM 08/08/2018    9:38 AM  GAD 7 : Generalized Anxiety Score  Nervous, Anxious, on Edge 2 3 1 1  $ Control/stop worrying 0 1 0 1  Worry too much - different things 0 2 0 3  Trouble relaxing 0 2 1 1  $ Restless 0 0 0 0  Easily annoyed or irritable 1 3 1 3  $ Afraid - awful might happen 0 0 0 0  Total GAD 7 Score 3 11 3 9  $ Anxiety Difficulty Somewhat difficult  Not difficult at all Very difficult   Assessment & Plan:   Problem List Items Addressed This Visit     Health maintenance examination - Primary (Chronic)    Preventative protocols reviewed and updated unless pt declined. Discussed healthy diet and lifestyle.       MDD (major depressive disorder), recurrent episode, moderate (HCC)    Chronic, stable period on current regimen through psychiatry.       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 The 10-year ASCVD risk score (Arnett DK, et al., 2019) is: 4.3%   Values used to calculate the score:     Age: [age]years     Sex: Female     Is Non-Hispanic African American: No     Diabetic: No     Tobacco smoker: No     Systolic Blood Pressure: 1Q000111QmmHg     Is BP treated: Yes     HDL Cholesterol: 37 mg/dL     Total Cholesterol: 248 mg/dL       Relevant Medications   lisinopril (ZESTRIL) 10 MG tablet   atorvastatin (  LIPITOR) 20 MG tablet    Acute sinusitis    R sided sinusitis ongoing 10+ days after recent dental work  Rx augmentin 10d course.  Update if ongoing symptoms after treatment.       Relevant Medications   amoxicillin-clavulanate (AUGMENTIN) 875-125 MG tablet   Attention deficit hyperactivity disorder (ADHD), predominantly inattentive type    On focalin XR 27m in am with focalin IR 129mBID, managed by psychiatrist.       Essential hypertension    Chronic, stable on lisinopril 1070m continue.       Relevant Medications   lisinopril (ZESTRIL) 10 MG tablet   atorvastatin (LIPITOR) 20 MG tablet   Obesity, Class I, BMI 30-34.9    Continue to encourage healthy diet and lifestyle choices.       Vitamin D deficiency    Levels low off replacement - rec start vit D 2000 IU daily.       Iron deficiency    Iron levels normal, CBC normal - stay off oral iron      Perimenopause    LMP 04/2022      Transaminitis    Mildly elevated ALT - will continue to monitor. ?fatty liver.        Meds ordered this encounter  Medications   lisinopril (ZESTRIL) 10 MG tablet    Sig: Take 1 tablet (10 mg total) by mouth daily.    Dispense:  90 tablet    Refill:  4   atorvastatin (LIPITOR) 20 MG tablet    Sig: Take 1 tablet (20 mg total) by mouth daily.    Dispense:  90 tablet    Refill:  3   amoxicillin-clavulanate (AUGMENTIN) 875-125 MG tablet    Sig: Take 1 tablet by mouth 2 (two) times daily for 10 days.    Dispense:  20 tablet    Refill:  0    No orders of the defined types were placed in this encounter.   Patient Instructions  Vitamin D was low - start 2000 units daily over the counter. Cholesterol levels were high - start atorvastatin 85m8mily, watch for muscle aches.  For likely right sinus infection - take augmentin antibiotic sent to pharmacy.  Push fluids and rest. Let us kKoreaw if not better after this.   Follow up plan: Return in about 1 year (around 01/31/2024) for annual exam, prior fasting  for blood work.  JaviRia Bush

## 2023-01-30 NOTE — Assessment & Plan Note (Signed)
R sided sinusitis ongoing 10+ days after recent dental work  Rx augmentin 10d course.  Update if ongoing symptoms after treatment.

## 2023-01-30 NOTE — Assessment & Plan Note (Signed)
Preventative protocols reviewed and updated unless pt declined. Discussed healthy diet and lifestyle.  

## 2023-01-30 NOTE — Assessment & Plan Note (Signed)
Mildly elevated ALT - will continue to monitor. ?fatty liver.

## 2023-01-30 NOTE — Assessment & Plan Note (Signed)
Chronic, stable period on current regimen through psychiatry.

## 2023-01-30 NOTE — Assessment & Plan Note (Signed)
Chronic, stable on lisinopril 69m - continue.

## 2023-01-30 NOTE — Assessment & Plan Note (Addendum)
On focalin XR 22m in am with focalin IR 18mBID, managed by psychiatrist.

## 2023-01-30 NOTE — Assessment & Plan Note (Signed)
LMP 04/2022

## 2023-01-30 NOTE — Assessment & Plan Note (Signed)
Levels low off replacement - rec start vit D 2000 IU daily.

## 2023-01-30 NOTE — Patient Instructions (Addendum)
Vitamin D was low - start 2000 units daily over the counter. Cholesterol levels were high - start atorvastatin 38m daily, watch for muscle aches.  For likely right sinus infection - take augmentin antibiotic sent to pharmacy.  Push fluids and rest. Let u[place]ore[place] if not better after this.

## 2023-01-30 NOTE — Assessment & Plan Note (Signed)
Continue to encourage healthy diet and lifestyle choices.

## 2023-01-30 NOTE — Assessment & Plan Note (Signed)
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 The 10-year ASCVD risk score (Arnett DK, et al., 2019) is: 4.3%   Values used to calculate the score:     Age: [age]years     Sex: Female     Is Non-Hispanic African American: No     Diabetic: No     Tobacco smoker: No     Systolic Blood Pressure: 1Q000111QmmHg     Is BP treated: Yes     HDL Cholesterol: 37 mg/dL     Total Cholesterol: 248 mg/dL

## 2023-01-30 NOTE — Assessment & Plan Note (Signed)
Iron levels normal, CBC normal - stay off oral iron

## 2023-02-27 IMAGING — MG MM DIGITAL SCREENING BILAT W/ TOMO AND CAD
8 series · 8 of 24 positions shown · non-contrast
Comparison: Previous exam(s).

CLINICAL DATA: Screening.

EXAM:
DIGITAL SCREENING BILATERAL MAMMOGRAM WITH TOMOSYNTHESIS AND CAD
TECHNIQUE: Bilateral screening digital craniocaudal and mediolateral oblique
mammograms were obtained. Bilateral screening digital breast
tomosynthesis was performed. The images were evaluated with
computer-aided detection.

[L CC synth-2D]
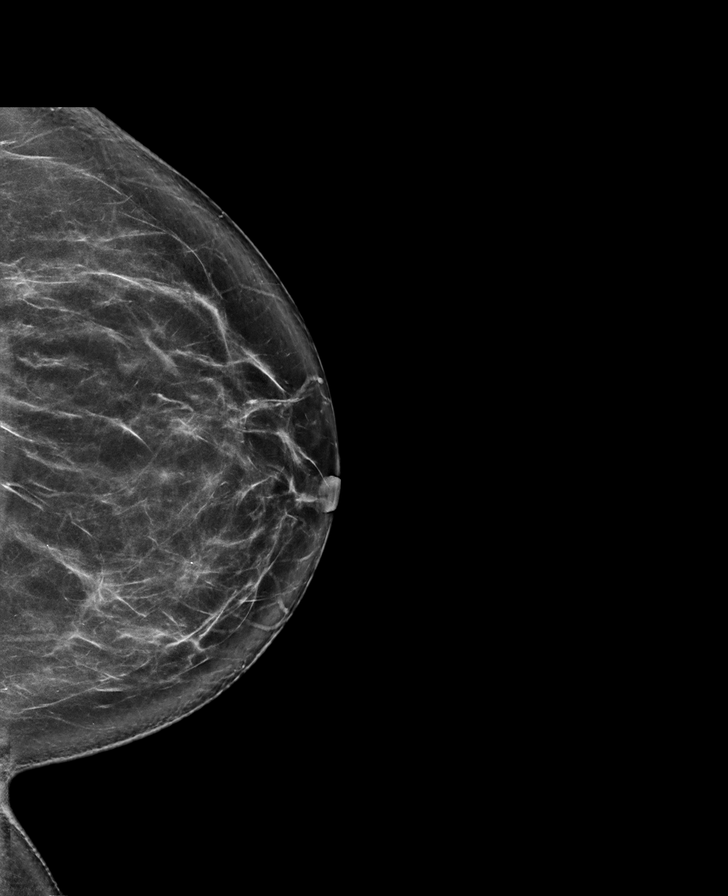

[R CC synth-2D]
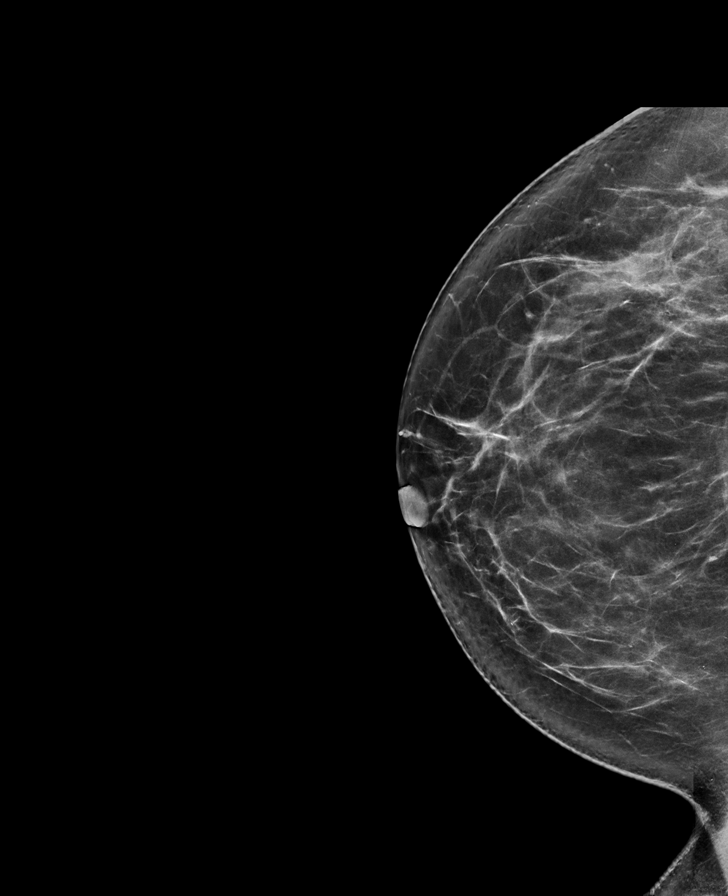

[L MLO synth-2D]
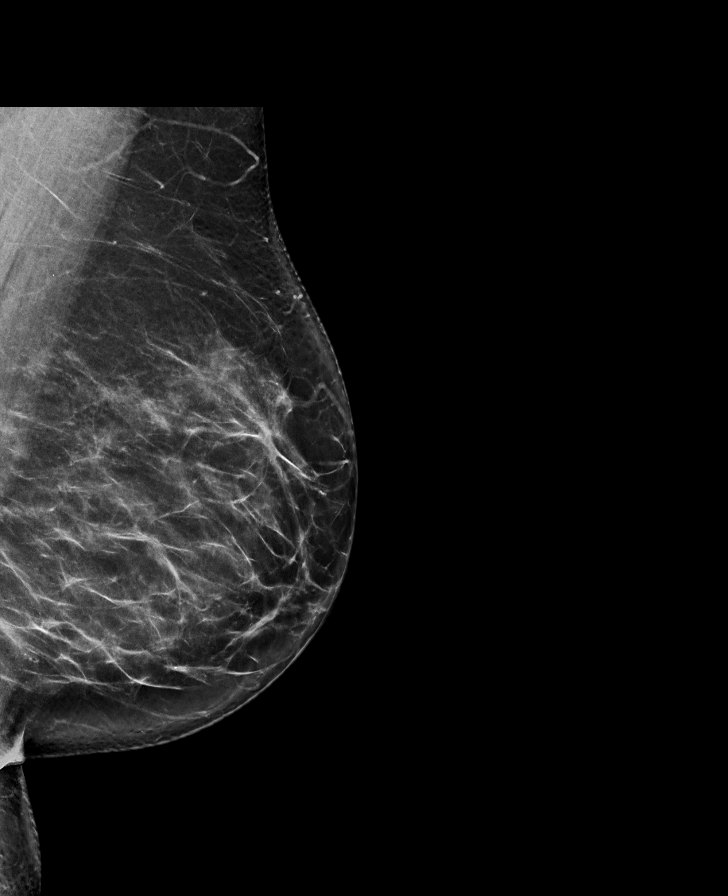

[R MLO synth-2D]
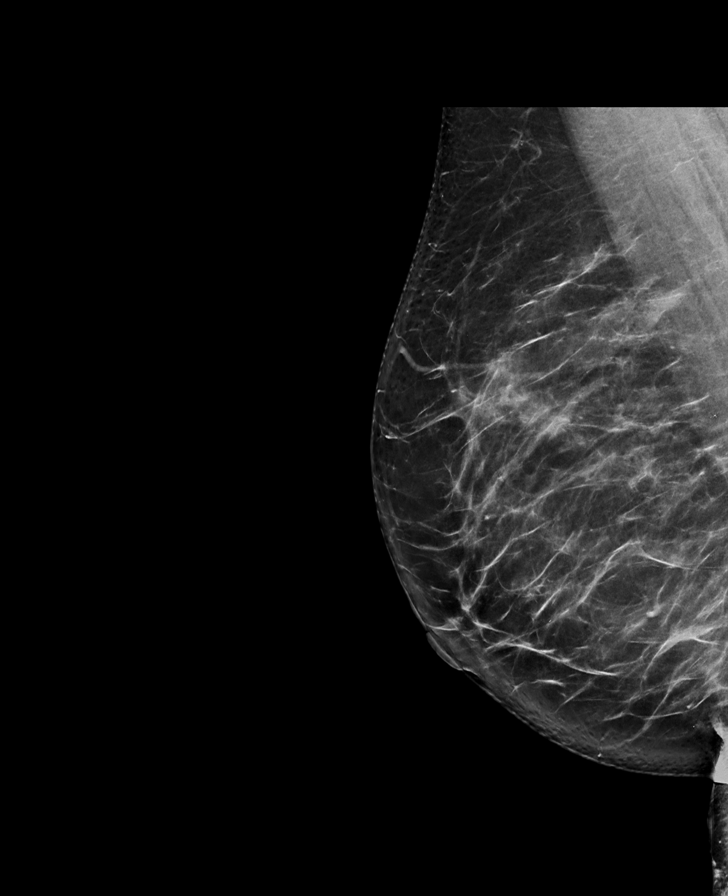

[L CC tomo · tomo slice 44/87.0]
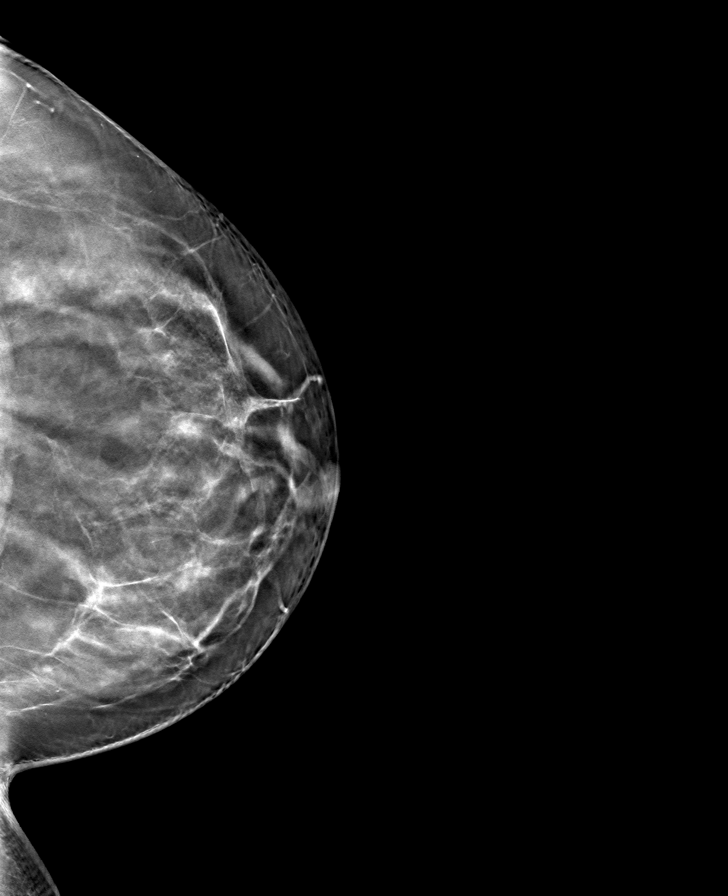

[L MLO tomo · tomo slice 45/88.0]
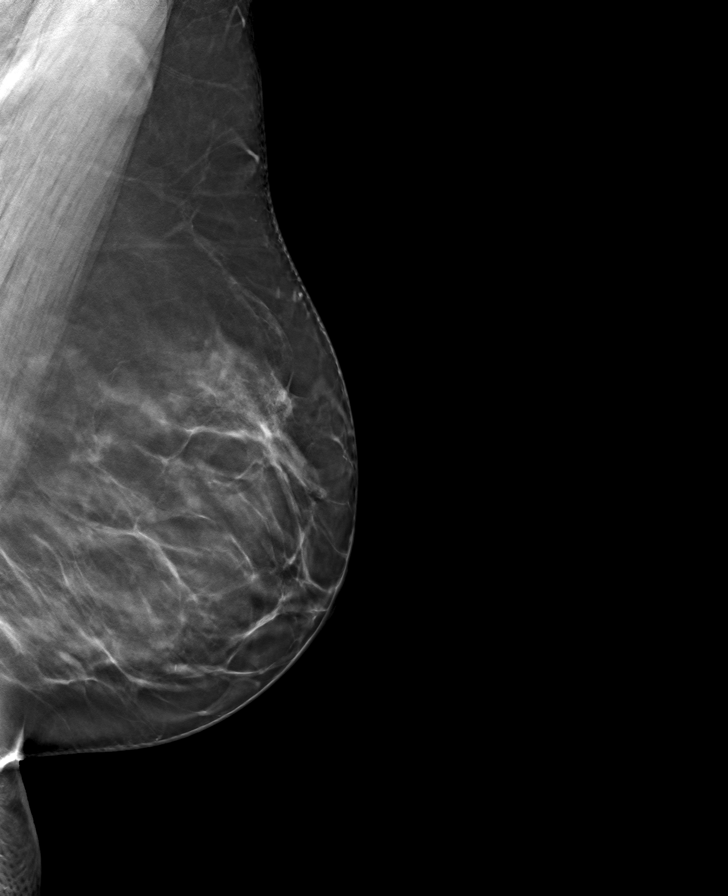

[R MLO tomo · tomo slice 45/90.0]
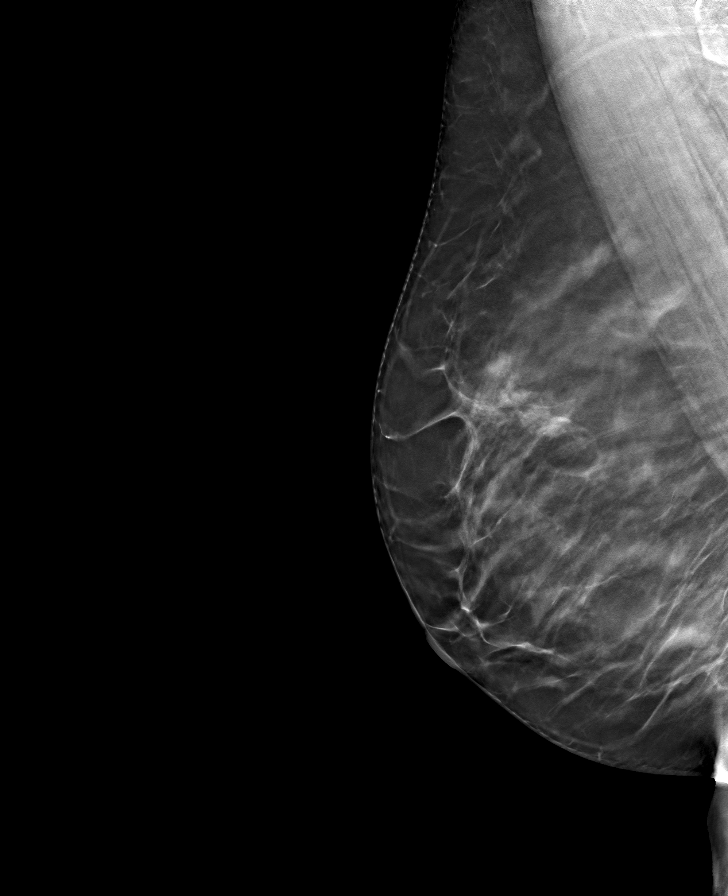

[R CC tomo · tomo slice 44/87.0]
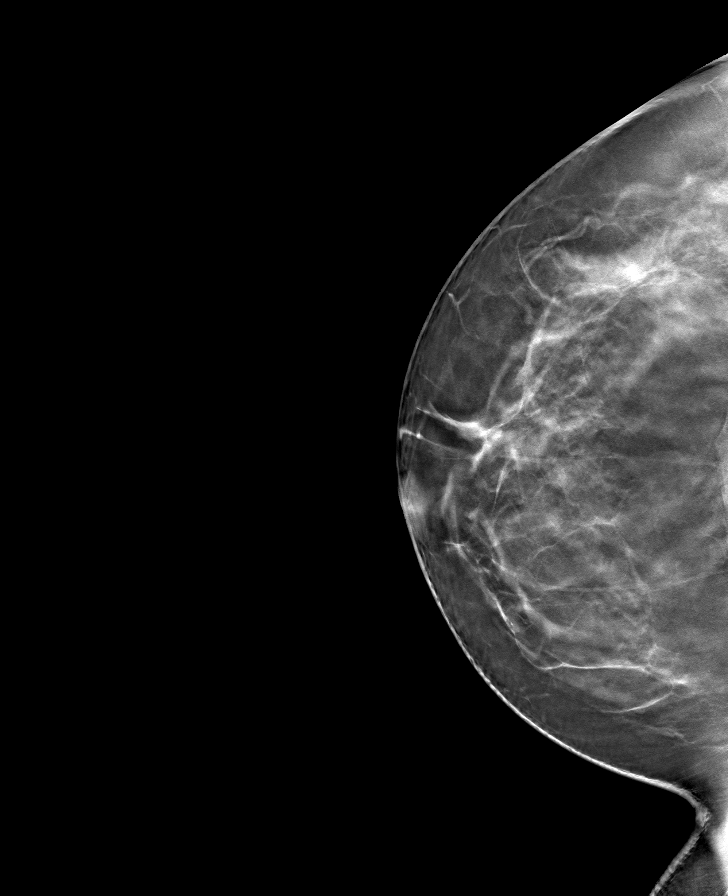

[8 of 24 positions shown; findings below may reference images not displayed]

ACR Breast Density Category b: There are scattered areas of
fibroglandular density.
FINDINGS: There are no findings suspicious for malignancy.
IMPRESSION: No mammographic evidence of malignancy. A result letter of this
screening mammogram will be mailed directly to the patient.

RECOMMENDATION:
Screening mammogram in one year. (Code:51-O-LD2)

BI-RADS CATEGORY  1: Negative.

## 2024-01-28 ENCOUNTER — Other Ambulatory Visit: Payer: Self-pay | Admitting: Family Medicine

## 2024-01-30 NOTE — Telephone Encounter (Signed)
 Patient due for cpe and labs. Please call to set up appointment. Let us know when scheduled so we can send in refill to last until appointment.

## 2024-01-31 NOTE — Telephone Encounter (Signed)
 Pt scheduled cpe for 04/17/24

## 2024-02-11 ENCOUNTER — Other Ambulatory Visit: Payer: Self-pay | Admitting: Family Medicine

## 2024-02-11 DIAGNOSIS — I10 Essential (primary) hypertension: Secondary | ICD-10-CM

## 2024-02-21 ENCOUNTER — Telehealth: Admitting: Physician Assistant

## 2024-02-21 DIAGNOSIS — R3989 Other symptoms and signs involving the genitourinary system: Secondary | ICD-10-CM | POA: Diagnosis not present

## 2024-02-21 MED ORDER — CEPHALEXIN 500 MG PO CAPS: 500.0000 mg | ORAL_CAPSULE | Freq: Two times a day (BID) | ORAL | 0 refills | Status: AC

## 2024-02-21 NOTE — Addendum Note (Signed)
 Addended by: Harlow Mares on: 02/21/2024 04:48 PM   Modules accepted: Orders

## 2024-02-21 NOTE — Progress Notes (Signed)
 Virtual Visit Consent   Teresa Villarreal, you are scheduled for a virtual visit with a  provider today. Just as with appointments in the office, your consent must be obtained to participate. Your consent will be active for this visit and any virtual visit you may have with one of our providers in the next 365 days. If you have a MyChart account, a copy of this consent can be sent to you electronically.  As this is a virtual visit, video technology does not allow for your provider to perform a traditional examination. This may limit your provider's ability to fully assess your condition. If your provider identifies any concerns that need to be evaluated in person or the need to arrange testing (such as labs, EKG, etc.), we will make arrangements to do so. Although advances in technology are sophisticated, we cannot ensure that it will always work on either your end or our end. If the connection with a video visit is poor, the visit may have to be switched to a telephone visit. With either a video or telephone visit, we are not always able to ensure that we have a secure connection.  By engaging in this virtual visit, you consent to the provision of healthcare and authorize for your insurance to be billed (if applicable) for the services provided during this visit. Depending on your insurance coverage, you may receive a charge related to this service.  I need to obtain your verbal consent now. Are you willing to proceed with your visit today? Teresa Villarreal has provided verbal consent on 02/21/2024 for a virtual visit (video or telephone). Piedad Climes, [place]  Date: 02/21/2024 1:27 PM   Virtual Visit via Video Note   I, Piedad Climes, connected with  Teresa Villarreal  ([id number], December 16, 1970) on 02/21/24 at  1:30 PM EDT by a video-enabled telemedicine application and verified that I am speaking with the correct person using two identifiers.  Location: Patient: Virtual Visit Location  Patient: Home Provider: Virtual Visit Location Provider: Home Office   I discussed the limitations of evaluation and management by telemedicine and the availability of in person appointments. The patient expressed understanding and agreed to proceed.    History of Present Illness: Teresa Villarreal is a 53 y.o. who identifies as a female who was assigned female at birth, and is being seen today for possible UTI. Endorses symptoms starting yesterday with painful urination. Notes nausea without vomiting. Denies back or belly pain. Notes substantial urgency, frequency. Is taking OTC AZO which helps slightly.   HPI: HPI  Problems:  Patient Active Problem List   Diagnosis Date Noted   Polyp of ascending colon    Perimenopause 12/04/2021   Transaminitis 12/04/2021   Chronic constipation 12/04/2021   Health maintenance examination 12/03/2021   Daytime somnolence 12/03/2021   Iron deficiency 11/02/2021   Menorrhagia 03/06/2020   History of kidney stones    Pyelonephritis 08/19/2019   Allergic rhinitis 04/08/2019   GERD (gastroesophageal reflux disease) 10/22/2018   Fatigue 10/22/2018   Essential hypertension 05/12/2017   Attention deficit hyperactivity disorder (ADHD), 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   Acute sinusitis 02/03/2016   MDD (major depressive disorder), recurrent episode, moderate (HCC) 07/29/2015   Frequent headaches 07/29/2015   HLD (hyperlipidemia) 07/29/2015   Obesity, Class I, BMI 30-34.9 04/22/2013   Ganglion of right wrist 06/01/2012   Vitamin D deficiency 03/18/2012    Allergies: No Known Allergies Medications:  Current Outpatient Medications:    atorvastatin (LIPITOR) 20 MG tablet,  TAKE 1 TABLET BY MOUTH EVERY DAY, Disp: 90 tablet, Rfl: 0   buPROPion (WELLBUTRIN XL) 300 MG 24 hr tablet, Take 1 tablet (300 mg total) by mouth daily., Disp: , Rfl:    escitalopram (LEXAPRO) 20 MG tablet, TAKE 1 TABLET BY MOUTH EVERY DAY, Disp: 90 tablet, Rfl: 3   lisinopril  (ZESTRIL) 10 MG tablet, TAKE 1 TABLET BY MOUTH EVERY DAY, Disp: 90 tablet, Rfl: 0  Observations/Objective: Patient is well-developed, well-nourished in no acute distress.  Resting comfortably  at home.  Head is normocephalic, atraumatic.  No labored breathing. Speech is clear and coherent with logical content.  Patient is alert and oriented at baseline.   Assessment and Plan: 1. Suspected UTI (Primary)  Classic UTI symptoms with absence of alarm signs or symptoms. Prior history of UTI. Will treat empirically with Keflex for suspected uncomplicated cystitis. Supportive measures and OTC medications reviewed. Strict in-person evaluation precautions discussed.    Follow Up Instructions: I discussed the assessment and treatment plan with the patient. The patient was provided an opportunity to ask questions and all were answered. The patient agreed with the plan and demonstrated an understanding of the instructions.  A copy of instructions were sent to the patient via MyChart unless otherwise noted below.   The patient was advised to call back or seek an in-person evaluation if the symptoms worsen or if the condition fails to improve as anticipated.    Piedad Climes, PA-C

## 2024-02-21 NOTE — Patient Instructions (Addendum)
 Mancel Parsons, thank you for joining Piedad Climes, PA-C for today's virtual visit.  While this provider is not your primary care provider (PCP), if your PCP is located in our provider database this encounter information will be shared with them immediately following your visit.   A [facility] MyChart account gives you access to today's visit and all your visits, tests, and labs performed at [facility] " click here if you don't have a [facility] MyChart account or go to [email or web address]  Consent: (Patient) Teresa [facility] provided verbal consent for this virtual visit at the beginning of the encounter.  Current Medications:  Current Outpatient Medications:    atorvastatin (LIPITOR) 20 MG tablet, TAKE 1 TABLET BY MOUTH EVERY DAY, Disp: 90 tablet, Rfl: 0   buPROPion (WELLBUTRIN XL) 300 MG 24 hr tablet, Take 1 tablet (300 mg total) by mouth daily., Disp: , Rfl:    dexmethylphenidate (FOCALIN XR) 15 MG 24 hr capsule, Take 15 mg by mouth every morning., Disp: , Rfl:    dexmethylphenidate (FOCALIN) 10 MG tablet, Take 1 tablet (10 mg total) by mouth 2 (two) times daily., Disp: 60 tablet, Rfl: 0   escitalopram (LEXAPRO) 20 MG tablet, TAKE 1 TABLET BY MOUTH EVERY DAY, Disp: 90 tablet, Rfl: 3   lisinopril (ZESTRIL) 10 MG tablet, TAKE 1 TABLET BY MOUTH EVERY DAY, Disp: 90 tablet, Rfl: 0   Medications ordered in this encounter:  No orders of the defined types were placed in this encounter.    *If you need refills on other medications prior to your next appointment, please contact your pharmacy*  Follow-Up: Call back or seek an in-person evaluation if the symptoms worsen or if the condition fails to improve as anticipated.  [facility] [phone]  Other Instructions Your symptoms are consistent with a bladder infection, also called acute cystitis. Please take your antibiotic (Keflex) as directed until all pills are gone.  Stay very well hydrated.  Consider  a daily probiotic (Align, Culturelle, or Activia) to help prevent stomach upset caused by the antibiotic.  Taking a probiotic daily may also help prevent recurrent UTIs.  Also consider taking AZO (Phenazopyridine) tablets to help decrease pain with urination.    Urinary Tract Infection A urinary tract infection (UTI) can occur any place along the urinary tract. The tract includes the kidneys, ureters, bladder, and urethra. A type of germ called bacteria often causes a UTI. UTIs are often helped with antibiotic medicine.  HOME CARE  If given, take antibiotics as told by your doctor. Finish them even if you start to feel better. Drink enough fluids to keep your pee (urine) clear or pale yellow. Avoid tea, drinks with caffeine, and bubbly (carbonated) drinks. Pee often. Avoid holding your pee in for a long time. Pee before and after having sex (intercourse). Wipe from front to back after you poop (bowel movement) if you are a woman. Use each tissue only once. GET HELP RIGHT AWAY IF:  You have back pain. You have lower belly (abdominal) pain. You have chills. You feel sick to your stomach (nauseous). You throw up (vomit). Your burning or discomfort with peeing does not go away. You have a fever. Your symptoms are not better in 3 days. MAKE SURE YOU:  Understand these instructions. Will watch your condition. Will get help right away if you are not doing well or get worse. Document Released: 05/16/2008 Document Revised: 08/22/2012 Document Reviewed: 06/28/2012 [facility] Patient Information 2015 [place], [place]. This information is  not intended to replace advice given to you by your health care provider. Make sure you discuss any questions you have with your health care provider.    If you have been instructed to have an in-person evaluation today at a local Urgent Care facility, please use the link below. It will take you to a list of all of our available [facility], including  address, phone number and hours of operation. Please do not delay care.  [facility]  If you or a family member do not have a primary care provider, use the link below to schedule a visit and establish care. When you choose a [facility] primary care physician or advanced practice provider, you gain a long-term partner in health. Find a Primary Care Provider  Learn more about 's in-office and virtual care options:  - Get Care Now

## 2024-03-13 ENCOUNTER — Telehealth

## 2024-03-13 ENCOUNTER — Ambulatory Visit
Admission: RE | Admit: 2024-03-13 | Discharge: 2024-03-13 | Disposition: A | Discharge status: Home / Self Care | Source: Ambulatory Visit | Attending: Emergency Medicine | Admitting: Emergency Medicine

## 2024-03-13 ENCOUNTER — Other Ambulatory Visit: Payer: Self-pay

## 2024-03-13 VITALS — BP 144/92 | HR 78 | Temp 99.0°F | Resp 18

## 2024-03-13 DIAGNOSIS — R350 Frequency of micturition: Secondary | ICD-10-CM | POA: Insufficient documentation

## 2024-03-13 LAB — POCT URINALYSIS DIP (MANUAL ENTRY)
Bilirubin, UA: NEGATIVE
Glucose, UA: NEGATIVE mg/dL
Ketones, POC UA: NEGATIVE mg/dL
Nitrite, UA: NEGATIVE
Protein Ur, POC: NEGATIVE mg/dL
Spec Grav, UA: 1.015
Urobilinogen, UA: 0.2 U/dL
pH, UA: 5.5

## 2024-03-13 MED ORDER — CIPROFLOXACIN HCL 500 MG PO TABS: 500.0000 mg | ORAL_TABLET | Freq: Two times a day (BID) | ORAL | 0 refills | Status: AC

## 2024-03-13 NOTE — Discharge Instructions (Signed)
 Your urinalysis does not show bacteria , your urine will be sent to the lab to determine exactly which bacteria is present, if any changes need to be made to your medications you will be notified  Begin use of ciprofloxacin every morning and every evening for 3 days  You may use over-the-counter Azo to help minimize your symptoms until antibiotic removes bacteria, this medication will turn your urine orange  Increase your fluid intake through use of water  As always practice good hygiene, wiping front to back and avoidance of scented vaginal products to prevent further irritation  If symptoms continue to persist after use of medication or recur please follow-up with urgent care or your primary doctor as needed

## 2024-03-13 NOTE — ED Provider Notes (Signed)
 Renaldo Fiddler    CSN: [id number] Arrival date & time: 03/13/24  1629      History   Chief Complaint Chief Complaint  Patient presents with   Urinary Frequency    Had a uti3/12 and got antibiotic .cleared up. Mild Symptoms started again yesterday. Now significant. Cramping in lower abdomen and back. Constant urge to urinate and not feeling like emptying bladder. Tried another virtual visit but told on person - Entered by patient   Dysuria    HPI Teresa [facility] is a 53 y.o. female.   Presents for evaluation of lower abdominal pressure, lower back pain, frequency, urgency and a tingling with urination present for 1 day.  Initially experienced symptoms around mid March, completed telehealth visit prescribed cephalexin, symptoms fully resolved.  Denies hematuria, vaginal symptoms, fever.  Past Medical History:  Diagnosis Date   Chicken pox    Depression    Frequent headaches    stress, approx 2x/wk   Genital warts    History of kidney stones    Hyperlipidemia    Hypertension    Motion sickness    amusement park rides   PONV (postoperative nausea and vomiting)     Patient Active Problem List   Diagnosis Date Noted   Polyp of ascending colon    Perimenopause 12/04/2021   Transaminitis 12/04/2021   Chronic constipation 12/04/2021   Health maintenance examination 12/03/2021   Daytime somnolence 12/03/2021   Iron deficiency 11/02/2021   Menorrhagia 03/06/2020   History of kidney stones    Pyelonephritis 08/19/2019   Allergic rhinitis 04/08/2019   GERD (gastroesophageal reflux disease) 10/22/2018   Fatigue 10/22/2018   Essential hypertension 05/12/2017   Attention deficit hyperactivity disorder (ADHD), 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   Acute sinusitis 02/03/2016   MDD (major depressive disorder), recurrent episode, moderate (HCC) 07/29/2015   Frequent headaches 07/29/2015   HLD (hyperlipidemia) 07/29/2015   Obesity, Class I, BMI 30-34.9 04/22/2013    Ganglion of right wrist 06/01/2012   Vitamin D deficiency 03/18/2012    Past Surgical History:  Procedure Laterality Date   COLONOSCOPY WITH PROPOFOL N/A 12/27/2021   SSP, int hem, rpt 5 yrs Servando Snare, Darren, MD)   CYSTOSCOPY  12/12/2008   ESOPHAGOGASTRODUODENOSCOPY (EGD) WITH PROPOFOL N/A 11/22/2018   Procedure: ESOPHAGOGASTRODUODENOSCOPY (EGD) WITH PROPOFOL;  Surgeon: Midge Minium, MD;  Location[facility];  Service: Endoscopy;  Laterality: N/A;   HYSTEROSCOPY  12/12/2008   KNEE ARTHROSCOPY Left 12/12/1988   POLYPECTOMY N/A 12/27/2021   Procedure: POLYPECTOMY;  Surgeon: Midge Minium, MD;  Location: [facility];  Service: Endoscopy;  Laterality: N/A;    OB History   No obstetric history on file.      Home Medications    Prior to Admission medications   Medication Sig Start Date End Date Taking? Authorizing Provider  ciprofloxacin (CIPRO) 500 MG tablet Take 1 tablet (500 mg total) by mouth 2 (two) times daily for 3 days. 03/13/24 03/16/24 Yes Aleyda Gindlesperger R, NP  atorvastatin (LIPITOR) 20 MG tablet TAKE 1 TABLET BY MOUTH EVERY DAY 01/31/24   Eustaquio Boyden, MD  buPROPion (WELLBUTRIN XL) 300 MG 24 hr tablet Take 1 tablet (300 mg total) by mouth daily. 01/30/23   Eustaquio Boyden, MD  escitalopram (LEXAPRO) 20 MG tablet TAKE 1 TABLET BY MOUTH EVERY DAY 08/05/22   Eustaquio Boyden, MD  lisinopril (ZESTRIL) 10 MG tablet TAKE 1 TABLET BY MOUTH EVERY DAY 02/13/24   Eustaquio Boyden, MD    Family History Family History  Problem Relation Age of Onset   Hyperlipidemia Mother    Heart disease Mother    Hypertension Mother    Alzheimer's disease Mother    COPD Mother    Hyperlipidemia Father    Hypertension Father    Mitochondrial disorder Brother        Mitochondrial encephalomyopathy lactic acidosis   Breast cancer Paternal Aunt    Arthritis Maternal Grandmother     Social History Social History   Tobacco Use   Smoking status: Never   Smokeless tobacco:  Never  Vaping Use   Vaping status: Never Used  Substance Use Topics   Alcohol use: Yes    Alcohol/week: 0.0 standard drinks of alcohol    Comment: occasional - 1x/mo   Drug use: No     Allergies   Patient has no known allergies.   Review of Systems Review of Systems   Physical Exam Triage Vital Signs ED Triage Vitals  Encounter Vitals Group     BP 03/13/24 1640 (!) 144/92     Systolic BP Percentile --      Diastolic BP Percentile --      Pulse Rate 03/13/24 1640 78     Resp 03/13/24 1640 18     Temp 03/13/24 1640 99 F (37.2 C)     Temp Source 03/13/24 1640 Oral     SpO2 03/13/24 1640 97 %     Weight --      Height --      Head Circumference --      Peak Flow --      Pain Score 03/13/24 1641 0     Pain Loc --      Pain Education --      Exclude from Growth Chart --    No data found.  Updated Vital Signs BP (!) 144/92 (BP Location: Left Arm)   Pulse 78   Temp 99 F (37.2 C) (Oral)   Resp 18   SpO2 97%   Visual Acuity Right Eye Distance:   Left Eye Distance:   Bilateral Distance:    Right Eye Near:   Left Eye Near:    Bilateral Near:     Physical Exam Constitutional:      Appearance: Normal appearance.  Eyes:     Extraocular Movements: Extraocular movements intact.  Pulmonary:     Effort: Pulmonary effort is normal.  Abdominal:     General: Abdomen is flat. Bowel sounds are normal.     Palpations: Abdomen is soft.     Tenderness: There is abdominal tenderness in the suprapubic area. There is no right CVA tenderness or left CVA tenderness.  Neurological:     Mental Status: She is alert and oriented to person, place, and time. Mental status is at baseline.      UC Treatments / Results  Labs (all labs ordered are listed, but only abnormal results are displayed) Labs Reviewed  POCT URINALYSIS DIP (MANUAL ENTRY) - Abnormal; Notable for the following components:      Result Value   Blood, UA moderate (*)    Leukocytes, UA Small (1+) (*)     All other components within normal limits  URINE CULTURE    EKG   Radiology No results found.  Procedures Procedures (including critical care time)  Medications Ordered in UC Medications - No data to display  Initial Impression / Assessment and Plan / UC Course  I have reviewed the triage vital signs and the nursing notes.  Pertinent  labs & imaging results that were available during my care of the patient were reviewed by me and considered in my medical decision making (see chart for details).  Urinary frequency  Urinalysis showing leukocytes, negative for nitrates, sent for culture, recent use of cephalexin therefore prophylactically placed on Cipro, recommended additional supportive care with follow-up as needed Final Clinical Impressions(s) / UC Diagnoses   Final diagnoses:  Urinary frequency     Discharge Instructions      Your urinalysis does not show bacteria , your urine will be sent to the lab to determine exactly which bacteria is present, if any changes need to be made to your medications you will be notified  Begin use of ciprofloxacin every morning and every evening for 3 days  You may use over-the-counter Azo to help minimize your symptoms until antibiotic removes bacteria, this medication will turn your urine orange  Increase your fluid intake through use of water  As always practice good hygiene, wiping front to back and avoidance of scented vaginal products to prevent further irritation  If symptoms continue to persist after use of medication or recur please follow-up with urgent care or your primary doctor as needed    ED Prescriptions     Medication Sig Dispense Auth. Provider   ciprofloxacin (CIPRO) 500 MG tablet Take 1 tablet (500 mg total) by mouth 2 (two) times daily for 3 days. 6 tablet Valinda Hoar, NP      PDMP not reviewed this encounter.   Valinda Hoar, NP 03/13/24 [phone]

## 2024-03-13 NOTE — ED Triage Notes (Signed)
 Patient presents to [facility] for evaluation of dysuria returning after taking  antibiotics for a recent UTI diagnosed via televisit.    She was completely improved, yesterday she began to have a return of lower abdominal pressure and then today has been having urgency, frequency, goose bumps during urination, lower mid back pain.  Denies burning with urination this time, yet.

## 2024-03-15 LAB — URINE CULTURE: Culture: 10000 — AB

## 2024-04-07 ENCOUNTER — Other Ambulatory Visit: Payer: Self-pay | Admitting: Family Medicine

## 2024-04-07 DIAGNOSIS — E7841 Elevated Lipoprotein(a): Secondary | ICD-10-CM

## 2024-04-07 DIAGNOSIS — E559 Vitamin D deficiency, unspecified: Secondary | ICD-10-CM

## 2024-04-07 DIAGNOSIS — E611 Iron deficiency: Secondary | ICD-10-CM

## 2024-04-11 ENCOUNTER — Other Ambulatory Visit (INDEPENDENT_AMBULATORY_CARE_PROVIDER_SITE_OTHER): Payer: Managed Care, Other (non HMO)

## 2024-04-11 DIAGNOSIS — E611 Iron deficiency: Secondary | ICD-10-CM | POA: Diagnosis not present

## 2024-04-11 DIAGNOSIS — E559 Vitamin D deficiency, unspecified: Secondary | ICD-10-CM | POA: Diagnosis not present

## 2024-04-11 DIAGNOSIS — E7841 Elevated Lipoprotein(a): Secondary | ICD-10-CM

## 2024-04-11 LAB — CBC WITH DIFFERENTIAL/PLATELET
Basophils Absolute: 0.1 10*3/uL (ref 0.0–0.1)
Basophils Relative: 0.9 % (ref 0.0–3.0)
Eosinophils Absolute: 0.3 10*3/uL (ref 0.0–0.7)
Eosinophils Relative: 4.2 % (ref 0.0–5.0)
HCT: 38.7 % (ref 36.0–46.0)
Hemoglobin: 13.1 g/dL (ref 12.0–15.0)
Lymphocytes Relative: 30.7 % (ref 12.0–46.0)
Lymphs Abs: 2 10*3/uL (ref 0.7–4.0)
MCHC: 34 g/dL (ref 30.0–36.0)
MCV: 94.3 fl (ref 78.0–100.0)
Monocytes Absolute: 0.5 10*3/uL (ref 0.1–1.0)
Monocytes Relative: 7.2 % (ref 3.0–12.0)
Neutro Abs: 3.7 10*3/uL (ref 1.4–7.7)
Neutrophils Relative %: 57 % (ref 43.0–77.0)
Platelets: 290 10*3/uL (ref 150.0–400.0)
RBC: 4.1 Mil/uL (ref 3.87–5.11)
RDW: 13.1 % (ref 11.5–15.5)
WBC: 6.4 10*3/uL (ref 4.0–10.5)

## 2024-04-11 LAB — LIPID PANEL
Cholesterol: 202 mg/dL — ABNORMAL HIGH (ref 0–200)
HDL: 38.4 mg/dL — ABNORMAL LOW (ref >39.00)
LDL Cholesterol: 109 mg/dL — ABNORMAL HIGH (ref 0–99)
NonHDL: 163.43
Total CHOL/HDL Ratio: 5
Triglycerides: 270 mg/dL — ABNORMAL HIGH (ref 0.0–149.0)
VLDL: 54 mg/dL — ABNORMAL HIGH (ref 0.0–40.0)

## 2024-04-11 LAB — COMPREHENSIVE METABOLIC PANEL WITH GFR
ALT: 24 U/L (ref 0–35)
AST: 18 U/L (ref 0–37)
Albumin: 4.3 g/dL (ref 3.5–5.2)
Alkaline Phosphatase: 121 U/L — ABNORMAL HIGH (ref 39–117)
BUN: 18 mg/dL (ref 6–23)
CO2: 30 meq/L (ref 19–32)
Calcium: 9.3 mg/dL (ref 8.4–10.5)
Chloride: 104 meq/L (ref 96–112)
Creatinine, Ser: 0.82 mg/dL (ref 0.40–1.20)
GFR: 81.6 mL/min (ref >60.00)
Glucose, Bld: 100 mg/dL — ABNORMAL HIGH (ref 70–99)
Potassium: 4.9 meq/L (ref 3.5–5.1)
Sodium: 141 meq/L (ref 135–145)
Total Bilirubin: 0.6 mg/dL (ref 0.2–1.2)
Total Protein: 6.7 g/dL (ref 6.0–8.3)

## 2024-04-11 LAB — FERRITIN: Ferritin: 60.9 ng/mL (ref 10.0–291.0)

## 2024-04-11 LAB — VITAMIN D 25 HYDROXY (VIT D DEFICIENCY, FRACTURES): VITD: 26.81 ng/mL — ABNORMAL LOW (ref 30.00–100.00)

## 2024-04-17 ENCOUNTER — Ambulatory Visit (INDEPENDENT_AMBULATORY_CARE_PROVIDER_SITE_OTHER): Payer: Managed Care, Other (non HMO) | Admitting: Family Medicine

## 2024-04-17 VITALS — BP 118/80 | HR 93 | Temp 98.5°F | Ht 64.5 in | Wt 197.5 lb

## 2024-04-17 DIAGNOSIS — E7841 Elevated Lipoprotein(a): Secondary | ICD-10-CM | POA: Diagnosis not present

## 2024-04-17 DIAGNOSIS — F331 Major depressive disorder, recurrent, moderate: Secondary | ICD-10-CM | POA: Diagnosis not present

## 2024-04-17 DIAGNOSIS — K5909 Other constipation: Secondary | ICD-10-CM

## 2024-04-17 DIAGNOSIS — E611 Iron deficiency: Secondary | ICD-10-CM

## 2024-04-17 DIAGNOSIS — F900 Attention-deficit hyperactivity disorder, predominantly inattentive type: Secondary | ICD-10-CM

## 2024-04-17 DIAGNOSIS — E66811 Obesity, class 1: Secondary | ICD-10-CM

## 2024-04-17 DIAGNOSIS — Z0000 Encounter for general adult medical examination without abnormal findings: Secondary | ICD-10-CM | POA: Diagnosis not present

## 2024-04-17 DIAGNOSIS — E559 Vitamin D deficiency, unspecified: Secondary | ICD-10-CM

## 2024-04-17 DIAGNOSIS — I10 Essential (primary) hypertension: Secondary | ICD-10-CM

## 2024-04-17 DIAGNOSIS — R400 Somnolence: Secondary | ICD-10-CM

## 2024-04-17 DIAGNOSIS — R7401 Elevation of levels of liver transaminase levels: Secondary | ICD-10-CM

## 2024-04-17 MED ORDER — ATORVASTATIN CALCIUM 20 MG PO TABS: 20.0000 mg | ORAL_TABLET | Freq: Every day | ORAL | 4 refills | Status: AC

## 2024-04-17 MED ORDER — LISINOPRIL 10 MG PO TABS: 10.0000 mg | ORAL_TABLET | Freq: Every day | ORAL | 4 refills | Status: AC

## 2024-04-17 MED ORDER — NALTREXONE HCL 50 MG PO TABS: 25.0000 mg | ORAL_TABLET | Freq: Every day | ORAL | 3 refills | Status: DC

## 2024-04-17 NOTE — Assessment & Plan Note (Addendum)
 Encourage healthy diet and lifestyle choices to affect sustainable weight loss.  She is already on wellbutrin . Notes trouble with sugar cravings - this is one way she addresses stress. Discussed options, encouraged work on healthy stress relieving strategies. Start naltrexone 25mg  daily for next few months. Reviewed need to monitor liver function.

## 2024-04-17 NOTE — Assessment & Plan Note (Addendum)
 Chronic, stable on atorvastatin  - continue this. LDL prior to statin up to 161W. The 10-year ASCVD risk score (Arnett DK, et al., 2019) is: 2.8%   Values used to calculate the score:     Age: 52 years     Sex: Female     Is Non-Hispanic African American: No     Diabetic: No     Tobacco smoker: No     Systolic Blood Pressure: 118 mmHg     Is BP treated: Yes     HDL Cholesterol: 38.4 mg/dL     Total Cholesterol: 202 mg/dL

## 2024-04-17 NOTE — Patient Instructions (Addendum)
 Set up well woman exam in the next year.  Call to schedule mammogram at your convenience: [facility] at [facility] [phone] Continue good water  and fiber in diet. Consider miralax 1 capful daily, hold for loose stools. Consider sennakot daily as needed. Let us  know if not improving or you develop abdominal pain or stop passing gas.  May try naltrexone 50mg  1/2 tablet daily for sugar cravings - once GI symptoms are improved.  Good to see you today Return as needed or in 1 year for next physical

## 2024-04-17 NOTE — Assessment & Plan Note (Signed)
 Preventative protocols reviewed and updated unless pt declined. Discussed healthy diet and lifestyle.

## 2024-04-17 NOTE — Assessment & Plan Note (Signed)
 Levels remain low despite vit D3 2000 units daily. Rec increase daily replacement to 4000 units daily.

## 2024-04-17 NOTE — Assessment & Plan Note (Signed)
 Mildly elevated ALP. Will continue to monitor.

## 2024-04-17 NOTE — Assessment & Plan Note (Addendum)
 H/o high ESS score in the past (2022)  I don't see where sleep apnea eval was completed. Will need to reassess next visit.

## 2024-04-17 NOTE — Assessment & Plan Note (Signed)
 Notes worsening after recent vacations however persistent despite healthy diet changes in the past 1 week.  She continues good fiber intake in diet as well as soluble fiber supplement use (metamucil).  Discussed sennakot and miralax use.  Not interested in pharmacotherapy for chronic constipation  No abd pain, passing flatus

## 2024-04-17 NOTE — Assessment & Plan Note (Signed)
 Chronic, stable period on lisinopril  10mg  daily - continue this.

## 2024-04-17 NOTE — Assessment & Plan Note (Signed)
 Followed by psychiatry

## 2024-04-17 NOTE — Assessment & Plan Note (Signed)
 Iron stores normal off oral iron replacement.

## 2024-04-17 NOTE — Progress Notes (Signed)
 Ph: [phone] Fax: [phone]   Patient ID: Teresa Villarreal, female    DOB: Feb 18, 1971, 53 y.o.   MRN: [id number]  This visit was conducted in person.  BP 118/80   Pulse 93   Temp 98.5 F (36.9 C) (Oral)   Ht 5' 4.5" (1.638 m)   Wt 197 lb 8 oz (89.6 kg)   SpO2 97%   BMI 33.38 kg/m    CC: CPE Subjective:   HPI: Teresa Villarreal is a 53 y.o. 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  Recent vacation - traveled to [place] and [place] then [place] in the past month.   MDD - continues Wellbutrin  XL 300mg  daily, now also on clonidine, Pristiq, and Azstarys. ADHD dx 2016 by psychological testing (Dr Luigi Sailor) now managed by psychiatrist Dr Denson Flake @ [facility] in [place].   Notes sugar craving.  Notes some constipation despite healthier diet choices in the past week.  Has had 2 UTIs in the past month  Preventative: COLONOSCOPY WITH PROPOFOL  12/27/2021 - SSP, int hem, rpt 5 yrs Ole Berkeley, Darren, MD) Well woman exam - last pap normal 05/2020 - h/o HPV and herpes. Rpt 3-5 yrs. Planning to establish with [facility].  Mammogram 05/2022 - [facility] - # provided to schedule appt LMP:  DEXA scan - not due Lung cancer screening - not due Flu shot - declined COVID shot - Moderna 02/2020, 03/2020  Tap 2012, 11/2021 Pneumonia shot - not due Shingrix  - 11/2021, 05/2022 Seat belt use discussed  Sunscreen use discussed. No changing moles on skin.  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 Interim medical history since our last visit reviewed. Allergies and medications reviewed  and updated. Outpatient Medications Prior to Visit  Medication Sig Dispense Refill   AZSTARYS 52.3-10.4 MG CAPS Take 1 capsule by mouth daily.     buPROPion  (WELLBUTRIN  XL) 300 MG 24 hr tablet Take 1 tablet (300 mg total) by mouth daily.     Cholecalciferol (VITAMIN D3) 50 MCG (2000 UT) capsule Take 2,000 Units by mouth daily.     cloNIDine HCl (KAPVAY) 0.1 MG TB12 ER tablet Take 0.1 mg by mouth 2 (two) times daily.     desvenlafaxine (PRISTIQ) 50 MG 24 hr tablet      atorvastatin  (LIPITOR) 20 MG tablet TAKE 1 TABLET BY MOUTH EVERY DAY 90 tablet 0   lisinopril  (ZESTRIL ) 10 MG tablet TAKE 1 TABLET BY MOUTH EVERY DAY 90 tablet 0   escitalopram  (LEXAPRO ) 20 MG tablet TAKE 1 TABLET BY MOUTH EVERY DAY 90 tablet 3   No facility-administered medications prior to visit.     Per HPI unless specifically indicated in ROS section below Review of Systems  Constitutional:  Negative for activity change, appetite change, chills, fatigue, fever and unexpected weight change.  HENT:  Negative for hearing loss.   Eyes:  Negative for visual disturbance.  Respiratory:  Negative for cough, chest tightness, shortness of breath and wheezing.   Cardiovascular:  Negative for chest pain, palpitations and leg swelling.  Gastrointestinal:  Positive for constipation. Negative for abdominal distention,  abdominal pain, blood in stool, diarrhea, nausea and vomiting.  Genitourinary:  Negative for difficulty urinating and hematuria.  Musculoskeletal:  Negative for arthralgias, myalgias and neck pain.  Skin:  Negative for rash.  Neurological:  Negative for dizziness, seizures, syncope and headaches.  Hematological:  Negative for adenopathy. Does not bruise/bleed easily.  Psychiatric/Behavioral:  Negative for dysphoric mood. The patient is not nervous/anxious.     Objective:  BP 118/80   Pulse 93   Temp 98.5 F (36.9 C) (Oral)   Ht 5' 4.5" (1.638 m)   Wt 197 lb 8 oz (89.6 kg)   SpO2 97%   BMI 33.38 kg/m   Wt  Readings from Last 3 Encounters:  04/17/24 197 lb 8 oz (89.6 kg)  01/30/23 202 lb 8 oz (91.9 kg)  12/27/21 188 lb (85.3 kg)      Physical Exam Vitals and nursing note reviewed.  Constitutional:      Appearance: Normal appearance. She is not ill-appearing.  HENT:     Head: Normocephalic and atraumatic.     Right Ear: Tympanic membrane, ear canal and external ear normal. There is no impacted cerumen.     Left Ear: Tympanic membrane, ear canal and external ear normal. There is no impacted cerumen.     Mouth/Throat:     Mouth: Mucous membranes are moist.     Pharynx: Oropharynx is clear. No oropharyngeal exudate or posterior oropharyngeal erythema.  Eyes:     General:        Right eye: No discharge.        Left eye: No discharge.     Extraocular Movements: Extraocular movements intact.     Conjunctiva/sclera: Conjunctivae normal.     Pupils: Pupils are equal, round, and reactive to light.  Neck:     Thyroid: No thyroid mass or thyromegaly.  Cardiovascular:     Rate and Rhythm: Normal rate and regular rhythm.     Pulses: Normal pulses.     Heart sounds: Normal heart sounds. No murmur heard. Pulmonary:     Effort: Pulmonary effort is normal. No respiratory distress.     Breath sounds: Normal breath sounds. No wheezing, rhonchi or rales.  Abdominal:     General: Bowel sounds are normal. There is no distension.     Palpations: Abdomen is soft. There is no mass.     Tenderness: There is no abdominal tenderness. There is no guarding or rebound.     Hernia: No hernia is present.  Musculoskeletal:     Cervical back: Normal range of motion and neck supple. No rigidity.     Right lower leg: No edema.     Left lower leg: No edema.  Lymphadenopathy:     Cervical: No cervical adenopathy.  Skin:    General: Skin is warm and dry.     Findings: No rash.  Neurological:     General: No focal deficit present.     Mental Status: She is alert. Mental status is at baseline.  Psychiatric:         Mood and Affect: Mood normal.        Behavior: Behavior normal.       Results for orders placed or performed in visit on 04/11/24  VITAMIN D  25 Hydroxy (Vit-D Deficiency, Fractures)   Collection Time: 04/11/24  8:42 AM  Result Value Ref Range   VITD 26.81 (L) 30.00 - 100.00 ng/mL  Ferritin   Collection Time: 04/11/24  8:42 AM  Result Value Ref  Range   Ferritin 60.9 10.0 - 291.0 ng/mL  CBC with Differential/Platelet   Collection Time: 04/11/24  8:42 AM  Result Value Ref Range   WBC 6.4 4.0 - 10.5 K/uL   RBC 4.10 3.87 - 5.11 Mil/uL   Hemoglobin 13.1 12.0 - 15.0 g/dL   HCT 16.1 09.6 - 04.5 %   MCV 94.3 78.0 - 100.0 fl   MCHC 34.0 30.0 - 36.0 g/dL   RDW 40.9 81.1 - 91.4 %   Platelets 290.0 150.0 - 400.0 K/uL   Neutrophils Relative % 57.0 43.0 - 77.0 %   Lymphocytes Relative 30.7 12.0 - 46.0 %   Monocytes Relative 7.2 3.0 - 12.0 %   Eosinophils Relative 4.2 0.0 - 5.0 %   Basophils Relative 0.9 0.0 - 3.0 %   Neutro Abs 3.7 1.4 - 7.7 K/uL   Lymphs Abs 2.0 0.7 - 4.0 K/uL   Monocytes Absolute 0.5 0.1 - 1.0 K/uL   Eosinophils Absolute 0.3 0.0 - 0.7 K/uL   Basophils Absolute 0.1 0.0 - 0.1 K/uL  Comprehensive metabolic panel with GFR   Collection Time: 04/11/24  8:42 AM  Result Value Ref Range   Sodium 141 135 - 145 mEq/L   Potassium 4.9 3.5 - 5.1 mEq/L   Chloride 104 96 - 112 mEq/L   CO2 30 19 - 32 mEq/L   Glucose, Bld 100 (H) 70 - 99 mg/dL   BUN 18 6 - 23 mg/dL   Creatinine, Ser 7.82 0.40 - 1.20 mg/dL   Total Bilirubin 0.6 0.2 - 1.2 mg/dL   Alkaline Phosphatase 121 (H) 39 - 117 U/L   AST 18 0 - 37 U/L   ALT 24 0 - 35 U/L   Total Protein 6.7 6.0 - 8.3 g/dL   Albumin 4.3 3.5 - 5.2 g/dL   GFR 95.62 >13.08 mL/min   Calcium  9.3 8.4 - 10.5 mg/dL  Lipid panel   Collection Time: 04/11/24  8:42 AM  Result Value Ref Range   Cholesterol 202 (H) 0 - 200 mg/dL   Triglycerides 657.8 (H) 0.0 - 149.0 mg/dL   HDL 46.96 (L) >29.52 mg/dL   VLDL 84.1 (H) 0.0 - 32.4 mg/dL   LDL  Cholesterol 401 (H) 0 - 99 mg/dL   Total CHOL/HDL Ratio 5    NonHDL 163.43     Assessment & Plan:   Problem List Items Addressed This Visit     Health maintenance examination (Chronic)   Preventative protocols reviewed and updated unless pt declined. Discussed healthy diet and lifestyle.       MDD (major depressive disorder), recurrent episode, moderate (HCC)   Chronic, stable period regularly sees psychiatrist.       Relevant Medications   desvenlafaxine (PRISTIQ) 50 MG 24 hr tablet   HLD (hyperlipidemia) - Primary   Chronic, stable on atorvastatin  - continue this. LDL prior to statin up to 027O. The 10-year ASCVD risk score (Arnett DK, et al., 2019) is: 2.8%   Values used to calculate the score:     Age: 76 years     Sex: Female     Is Non-Hispanic African American: No     Diabetic: No     Tobacco smoker: No     Systolic Blood Pressure: 118 mmHg     Is BP treated: Yes     HDL Cholesterol: 38.4 mg/dL     Total Cholesterol: 202 mg/dL       Relevant Medications   atorvastatin  (LIPITOR) 20 MG tablet  lisinopril  (ZESTRIL ) 10 MG tablet   Attention deficit hyperactivity disorder (ADHD), predominantly inattentive type   Followed by psychiatry.       Essential hypertension   Chronic, stable period on lisinopril  10mg  daily - continue this.       Relevant Medications   atorvastatin  (LIPITOR) 20 MG tablet   lisinopril  (ZESTRIL ) 10 MG tablet   Obesity, Class I, BMI 30-34.9   Encourage healthy diet and lifestyle choices to affect sustainable weight loss.  She is already on wellbutrin . Notes trouble with sugar cravings - this is one way she addresses stress. Discussed options, encouraged work on healthy stress relieving strategies. Start naltrexone 25mg  daily for next few months. Reviewed need to monitor liver function.       Vitamin D  deficiency   Levels remain low despite vit D3 2000 units daily. Rec increase daily replacement to 4000 units daily.       Iron  deficiency   Iron stores normal off oral iron replacement.       Daytime somnolence   H/o high ESS score in the past (2022)  I don't see where sleep apnea eval was completed. Will need to reassess next visit.       Transaminitis   Mildly elevated ALP. Will continue to monitor.       Chronic constipation   Notes worsening after recent vacations however persistent despite healthy diet changes in the past 1 week.  She continues good fiber intake in diet as well as soluble fiber supplement use (metamucil).  Discussed sennakot and miralax use.  Not interested in pharmacotherapy for chronic constipation  No abd pain, passing flatus        Meds ordered this encounter  Medications   atorvastatin  (LIPITOR) 20 MG tablet    Sig: Take 1 tablet (20 mg total) by mouth daily.    Dispense:  90 tablet    Refill:  4   lisinopril  (ZESTRIL ) 10 MG tablet    Sig: Take 1 tablet (10 mg total) by mouth daily.    Dispense:  90 tablet    Refill:  4   naltrexone (DEPADE) 50 MG tablet    Sig: Take 0.5 tablets (25 mg total) by mouth daily.    Dispense:  15 tablet    Refill:  3    No orders of the defined types were placed in this encounter.   Patient Instructions  Set up well woman exam in the next year.  Call to schedule mammogram at your convenience: [facility] [phone] Continue good water  and fiber in diet. Consider miralax 1 capful daily, hold for loose stools. Consider sennakot daily as needed. Let us  know if not improving or you develop abdominal pain or stop passing gas.  May try naltrexone 50mg  1/2 tablet daily for sugar cravings - once GI symptoms are improved.  Good to see you today Return as needed or in 1 year for next physical   Follow up plan: Return in about 1 year (around 04/17/2025), or if symptoms worsen or fail to improve, for annual exam, prior fasting for blood work.  Claire Crick, MD

## 2024-04-17 NOTE — Assessment & Plan Note (Signed)
 Chronic, stable period regularly sees psychiatrist.

## 2024-05-13 ENCOUNTER — Ambulatory Visit
Admission: RE | Admit: 2024-05-13 | Discharge: 2024-05-13 | Disposition: A | Discharge status: Home / Self Care | Source: Ambulatory Visit | Attending: Emergency Medicine | Admitting: Emergency Medicine

## 2024-05-13 ENCOUNTER — Ambulatory Visit: Payer: Self-pay

## 2024-05-13 VITALS — BP 116/82 | HR 81 | Temp 98.4°F | Resp 18 | Ht 65.0 in | Wt 197.0 lb

## 2024-05-13 DIAGNOSIS — R350 Frequency of micturition: Secondary | ICD-10-CM | POA: Insufficient documentation

## 2024-05-13 LAB — POCT URINALYSIS DIP (MANUAL ENTRY)
Bilirubin, UA: NEGATIVE
Glucose, UA: NEGATIVE mg/dL
Nitrite, UA: NEGATIVE
Protein Ur, POC: 30 mg/dL — AB
Spec Grav, UA: 1.025 (ref 1.010–1.025)
Urobilinogen, UA: 0.2 U/dL
pH, UA: 5.5 (ref 5.0–8.0)

## 2024-05-13 MED ORDER — NITROFURANTOIN MONOHYD MACRO 100 MG PO CAPS: 100.0000 mg | ORAL_CAPSULE | Freq: Two times a day (BID) | ORAL | 0 refills | Status: DC

## 2024-05-13 NOTE — Telephone Encounter (Signed)
 Noted. Agree.

## 2024-05-13 NOTE — Telephone Encounter (Signed)
 Noted. Thanks.

## 2024-05-13 NOTE — Telephone Encounter (Signed)
 Recommend OV with possible UCx given the started frequency of UTIs.

## 2024-05-13 NOTE — ED Provider Notes (Signed)
 Teresa Villarreal    CSN: [id number] Arrival date & time: 05/13/24  1745      History   Chief Complaint Chief Complaint  Patient presents with   Urinary Frequency    This is the 3rd time since March that I believe I have a UTI. This morning it started with discomfort when urinating, then urgency and cramping a little later, nausea, cramping, shivers when urinating. Urine is light/clear and lots of fluids. - Entered by patient   Dysuria    HPI Teresa Villarreal is a 53 y.o. female.   Patient presents for evaluation of urinary frequency, dysuria, constant lower abdominal and mid lower back pain and a foul urinary odor beginning this morning.  Endorses reoccurring UTI since March 2025.  Denies vaginal symptoms, hematuria or fever.  Past Medical History:  Diagnosis Date   Chicken pox    Depression    Frequent headaches    stress, approx 2x/wk   Genital warts    History of kidney stones    Hyperlipidemia    Hypertension    Motion sickness    amusement park rides   PONV (postoperative nausea and vomiting)    Pyelonephritis 08/19/2019    Patient Active Problem List   Diagnosis Date Noted   Polyp of ascending colon    Perimenopause 12/04/2021   Transaminitis 12/04/2021   Chronic constipation 12/04/2021   Health maintenance examination 12/03/2021   Daytime somnolence 12/03/2021   Iron deficiency 11/02/2021   Menorrhagia 03/06/2020   History of kidney stones    Allergic rhinitis 04/08/2019   GERD (gastroesophageal reflux disease) 10/22/2018   Fatigue 10/22/2018   Essential hypertension 05/12/2017   Attention deficit hyperactivity disorder (ADHD), 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   MDD (major depressive disorder), recurrent episode, moderate (HCC) 07/29/2015   Frequent headaches 07/29/2015   HLD (hyperlipidemia) 07/29/2015   Obesity, Class I, BMI 30-34.9 04/22/2013   Vitamin D  deficiency 03/18/2012    Past Surgical History:  Procedure Laterality Date    COLONOSCOPY WITH PROPOFOL  N/A 12/27/2021   SSP, int hem, rpt 5 yrs Ole Berkeley, Darren, MD)   CYSTOSCOPY  12/12/2008   ESOPHAGOGASTRODUODENOSCOPY (EGD) WITH PROPOFOL  N/A 11/22/2018   Procedure: ESOPHAGOGASTRODUODENOSCOPY (EGD) WITH PROPOFOL ;  Surgeon: Marnee Sink, MD;  Location: [facility];  Service: Endoscopy;  Laterality: N/A;   HYSTEROSCOPY  12/12/2008   KNEE ARTHROSCOPY Left 12/12/1988   POLYPECTOMY N/A 12/27/2021   Procedure: POLYPECTOMY;  Surgeon: Marnee Sink, MD;  Location: [facility];  Service: Endoscopy;  Laterality: N/A;    OB History   No obstetric history on file.      Home Medications    Prior to Admission medications   Medication Sig Start Date End Date Taking? Authorizing Provider  nitrofurantoin, macrocrystal-monohydrate, (MACROBID) 100 MG capsule Take 1 capsule (100 mg total) by mouth 2 (two) times daily. 05/13/24  Yes Primrose Oler R, NP  atorvastatin  (LIPITOR) 20 MG tablet Take 1 tablet (20 mg total) by mouth daily. 04/17/24   Claire Crick, MD  AZSTARYS 52.3-10.4 MG CAPS Take 1 capsule by mouth daily. 04/14/24   [provider]  buPROPion  (WELLBUTRIN  XL) 300 MG 24 hr tablet Take 1 tablet (300 mg total) by mouth daily. 01/30/23   Claire Crick, MD  Cholecalciferol (VITAMIN D3) 50 MCG (2000 UT) capsule Take 2,000 Units by mouth daily.    [provider]  cloNIDine HCl (KAPVAY) 0.1 MG TB12 ER tablet Take 0.1 mg by mouth 2 (two) times daily. 03/19/24   [provider]  desvenlafaxine (PRISTIQ) 50 MG 24 hr tablet     [provider]  lisinopril  (ZESTRIL ) 10 MG tablet Take 1 tablet (10 mg total) by mouth daily. 04/17/24   Claire Crick, MD  naltrexone  (DEPADE) 50 MG tablet Take 0.5 tablets (25 mg total) by mouth daily. 04/17/24   Claire Crick, MD    Family History Family History  Problem Relation Age of Onset   Hyperlipidemia Mother    Heart disease Mother    Hypertension Mother    Alzheimer's disease Mother     COPD Mother    Hyperlipidemia Father    Hypertension Father    Mitochondrial disorder Brother        Mitochondrial encephalomyopathy lactic acidosis   Breast cancer Paternal Aunt    Arthritis Maternal Grandmother     Social History Social History   Tobacco Use   Smoking status: Never   Smokeless tobacco: Never  Vaping Use   Vaping status: Never Used  Substance Use Topics   Alcohol use: Yes    Alcohol/week: 0.0 standard drinks of alcohol    Comment: occasional - 1x/mo   Drug use: No     Allergies   Patient has no known allergies.   Review of Systems Review of Systems   Physical Exam Triage Vital Signs ED Triage Vitals  Encounter Vitals Group     BP 05/13/24 1757 116/82     Systolic BP Percentile --      Diastolic BP Percentile --      Pulse Rate 05/13/24 1757 81     Resp 05/13/24 1757 18     Temp 05/13/24 1757 98.4 F (36.9 C)     Temp Source 05/13/24 1757 Oral     SpO2 05/13/24 1757 97 %     Weight 05/13/24 1755 197 lb (89.4 kg)     Height 05/13/24 1755 5\' 5"  (1.651 m)     Head Circumference --      Peak Flow --      Pain Score 05/13/24 1755 5     Pain Loc --      Pain Education --      Exclude from Growth Chart --    No data found.  Updated Vital Signs BP 116/82 (BP Location: Left Arm)   Pulse 81   Temp 98.4 F (36.9 C) (Oral)   Resp 18   Ht 5\' 5"  (1.651 m)   Wt 197 lb (89.4 kg)   LMP  (LMP Unknown)   SpO2 97%   BMI 32.78 kg/m   Visual Acuity Right Eye Distance:   Left Eye Distance:   Bilateral Distance:    Right Eye Near:   Left Eye Near:    Bilateral Near:     Physical Exam Constitutional:      Appearance: Normal appearance.  Eyes:     Extraocular Movements: Extraocular movements intact.  Pulmonary:     Effort: Pulmonary effort is normal.  Abdominal:     General: There is no distension.     Tenderness: There is no abdominal tenderness. There is right CVA tenderness. There is no left CVA tenderness or guarding.   Neurological:     Mental Status: She is alert and oriented to person, place, and time. Mental status is at baseline.      UC Treatments / Results  Labs (all labs ordered are listed, but only abnormal results are displayed) Labs Reviewed  POCT URINALYSIS DIP (MANUAL ENTRY) - Abnormal; Notable for the  following components:      Result Value   Clarity, UA cloudy (*)    Ketones, POC UA trace (5) (*)    Blood, UA moderate (*)    Protein Ur, POC =30 (*)    Leukocytes, UA Small (1+) (*)    All other components within normal limits  URINE CULTURE    EKG   Radiology No results found.  Procedures Procedures (including critical care time)  Medications Ordered in UC Medications - No data to display  Initial Impression / Assessment and Plan / UC Course  I have reviewed the triage vital signs and the nursing notes.  Pertinent labs & imaging results that were available during my care of the patient were reviewed by me and considered in my medical decision making (see chart for details).  Urinary Frequency  Urinalysis showing leukocytes and hemoglobin, negative for nitrates, discussed findings, sent for culture, last culture in April 2025 showing E. coli, antibiotic chosen from list, prescribed Macrobid due to recent use of Cipro , recommended supportive care with follow-up as needed Final Clinical Impressions(s) / UC Diagnoses   Final diagnoses:  Urinary frequency   Discharge Instructions      Your urinalysis is not show bacteria at this time, your urine will be sent to the lab to determine exactly which bacteria is present, if any changes need to be made to your medications you will be notified  Begin use of Macrobid  twice daily for 5 days  You may use over-the-counter Azo to help minimize your symptoms until antibiotic removes bacteria, this medication will turn your urine orange  Increase your fluid intake through use of water   As always practice good hygiene, wiping  front to back and avoidance of scented vaginal products to prevent further irritation  If symptoms continue to persist after use of medication or recur please follow-up with urgent care or your primary doctor as needed   ED Prescriptions     Medication Sig Dispense Auth. Provider   nitrofurantoin, macrocrystal-monohydrate, (MACROBID) 100 MG capsule Take 1 capsule (100 mg total) by mouth 2 (two) times daily. 10 capsule Sloan Takagi R, NP      PDMP not reviewed this encounter.   Reena Canning, [place] 05/13/24 [phone]

## 2024-05-13 NOTE — Telephone Encounter (Signed)
 Copied From CRM [id number]. Reason for Triage: Patient cramping, frequent urination and nauseous - says this is her 3rd UTI that started this morning and would like med sent to pharmacy on file   Chief Complaint: urination pain Symptoms: burning with urination, pressure,  Frequency: today Pertinent Negatives: Patient denies blood in urine Disposition: [] ED /[] Urgent Care (no appt availability in office) / [x] Appointment(In office/virtual)/ []  [facility]/ [] Home Care/ [x] Refused Recommended Disposition /[] [facility]/ [x]  Follow-up with PCP Additional Notes: pt called stating recently having an increase in UTI's.  Pt states she woke up this morning and started having burning with urination, pressure, increased frequency and would like to see if PCP could call in an ABX to help: offered appt: pt refused appt at present time stated she would like to hear back from PCP prior to scheduling b/c she just had UTI recently.  Reason for Disposition  > 2 UTI's in last year  Answer Assessment - Initial Assessment Questions 1. SEVERITY: "How bad is the pain?"  (e.g., Scale 1-10; mild, moderate, or severe)   - MILD (1-3): complains slightly about urination hurting   - MODERATE (4-7): interferes with normal activities     - SEVERE (8-10): excruciating, unwilling or unable to urinate because of the pain      moderate 2. FREQUENCY: "How many times have you had painful urination today?"      Every time urinate 3. PATTERN: "Is pain present every time you urinate or just sometimes?"      Comes and goes 4. ONSET: "When did the painful urination start?"      today 5. FEVER: "Do you have a fever?" If Yes, ask: "What is your temperature, how was it measured, and when did it start?"     no 6. PAST UTI: "Have you had a urine infection before?" If Yes, ask: "When was the last time?" and "What happened that time?"      yes 7. CAUSE: "What do you think is causing the painful urination?"  (e.g.,  UTI, scratch, Herpes sore)     UTI 8. OTHER SYMPTOMS: "Do you have any other symptoms?" (e.g., blood in urine, flank pain, genital sores, urgency, vaginal discharge)     no 9. PREGNANCY: "Is there any chance you are pregnant?" "When was your last menstrual period?"     no  Protocols used: Urination Pain - Female-A-AH

## 2024-05-13 NOTE — ED Triage Notes (Signed)
 Patient states urinary frequency and pain since this morning, has had a few UTIs since March.  Also having some lower abdominal pain and lower back pain that also started today.

## 2024-05-13 NOTE — Telephone Encounter (Signed)
 Spoke with pt relaying Dr Harrel Lim message. 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 Pt is concerned since it seems to be so soon since last UTI. Says she will go today after work. Fyi to Dr Vallarie Gauze and Dr Crissie Dome.

## 2024-05-13 NOTE — Discharge Instructions (Addendum)
 Your urinalysis is not show bacteria at this time, your urine will be sent to the lab to determine exactly which bacteria is present, if any changes need to be made to your medications you will be notified  Begin use of Macrobid  twice daily for 5 days  You may use over-the-counter Azo to help minimize your symptoms until antibiotic removes bacteria, this medication will turn your urine orange  Increase your fluid intake through use of water   As always practice good hygiene, wiping front to back and avoidance of scented vaginal products to prevent further irritation  If symptoms continue to persist after use of medication or recur please follow-up with urgent care or your primary doctor as needed

## 2024-05-15 ENCOUNTER — Ambulatory Visit: Payer: Self-pay

## 2024-05-15 LAB — URINE CULTURE: Culture: 80000 — AB

## 2024-07-20 ENCOUNTER — Encounter

## 2024-07-29 ENCOUNTER — Ambulatory Visit

## 2024-07-29 ENCOUNTER — Ambulatory Visit: Admission: EM | Admit: 2024-07-29 | Discharge: 2024-07-29 | Disposition: A | Discharge status: Home / Self Care

## 2024-07-29 DIAGNOSIS — J069 Acute upper respiratory infection, unspecified: Secondary | ICD-10-CM | POA: Diagnosis not present

## 2024-07-29 MED ORDER — AEROCHAMBER MV MISC: 2 refills | Status: AC

## 2024-07-29 MED ORDER — PROMETHAZINE-DM 6.25-15 MG/5ML PO SYRP: 5.0000 mL | ORAL_SOLUTION | Freq: Four times a day (QID) | ORAL | 0 refills | Status: AC | PRN

## 2024-07-29 MED ORDER — BENZONATATE 100 MG PO CAPS
200.0000 mg | ORAL_CAPSULE | Freq: Three times a day (TID) | ORAL | 0 refills | Status: AC
Start: 2024-07-29 — End: ?

## 2024-07-29 MED ORDER — AMOXICILLIN-POT CLAVULANATE 875-125 MG PO TABS: 1.0000 | ORAL_TABLET | Freq: Two times a day (BID) | ORAL | 0 refills | Status: AC

## 2024-07-29 MED ORDER — ALBUTEROL SULFATE HFA 108 (90 BASE) MCG/ACT IN AERS: 2.0000 | INHALATION_SPRAY | RESPIRATORY_TRACT | 0 refills | Status: AC | PRN

## 2024-07-29 MED ORDER — IPRATROPIUM BROMIDE 0.06 % NA SOLN: 2.0000 | Freq: Four times a day (QID) | NASAL | 12 refills | Status: AC

## 2024-07-29 NOTE — ED Provider Notes (Signed)
 [facility]    CSN: [id number] Arrival date & time: 07/29/24  1635      History   Chief Complaint Chief Complaint  Patient presents with   Headache   Sore Throat   Fatigue    HPI Teresa Villarreal is a 53 y.o. female.   HPI  53 year old female with past medical history significant for MDD, hyperlipidemia, central hypertension, GERD, and ADHD presents for evaluation of respiratory symptoms that been going on for last 2 weeks.  These include headache, nasal congestion and postnasal drip, sore throat, fatigue, nonproductive cough, shortness of breath, and nausea.  She denies any fever, ear pain, wheezing, vomiting or diarrhea.  No known sick contacts or recent travel out of state or country.  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   Ptosis of both eyebrows 03/17/2022   Senile ectropion of both lower eyelids 03/17/2022   Polyp of ascending colon    Perimenopause 12/04/2021   Transaminitis 12/04/2021   Chronic constipation 12/04/2021   Health maintenance examination 12/03/2021   Daytime somnolence 12/03/2021   Iron deficiency 11/02/2021   Menorrhagia 03/06/2020   History of kidney stones    Allergic rhinitis 04/08/2019   GERD (gastroesophageal reflux disease) 10/22/2018   Fatigue 10/22/2018   Essential hypertension 05/12/2017   Attention deficit hyperactivity disorder (ADHD), 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   MDD (major depressive disorder), recurrent episode, moderate (HCC) 07/29/2015   Frequent headaches 07/29/2015   HLD  (hyperlipidemia) 07/29/2015   Obesity, Class I, BMI 30-34.9 04/22/2013   Vitamin D  deficiency 03/18/2012    Past Surgical History:  Procedure Laterality Date   COLONOSCOPY WITH PROPOFOL  N/A 12/27/2021   SSP, int hem, rpt 5 yrs Renne, Darren, MD)   CYSTOSCOPY  12/12/2008   ESOPHAGOGASTRODUODENOSCOPY (EGD) WITH PROPOFOL  N/A 11/22/2018   Procedure: ESOPHAGOGASTRODUODENOSCOPY (EGD) WITH PROPOFOL ;  Surgeon: Jinny Carmine, MD;  Location: [facility];  Service: Endoscopy;  Laterality: N/A;   HYSTEROSCOPY  12/12/2008   KNEE ARTHROSCOPY Left 12/12/1988   POLYPECTOMY N/A 12/27/2021   Procedure: POLYPECTOMY;  Surgeon: Jinny Carmine, MD;  Location: [facility];  Service: Endoscopy;  Laterality: N/A;    OB History   No obstetric history on file.      Home Medications    Prior to Admission medications   Medication Sig Start Date End Date Taking? Authorizing Provider  albuterol  (VENTOLIN  HFA) 108 (90 Base) MCG/ACT inhaler Inhale 2 puffs into the lungs every 4 (four) hours as needed. 07/29/24  Yes Bernardino Ditch, NP  amoxicillin -clavulanate (AUGMENTIN ) 875-125 MG tablet Take 1 tablet by mouth every 12 (twelve) hours for 10 days. 07/29/24 08/08/24 Yes Bernardino Ditch, NP  benzonatate  (TESSALON ) 100 MG capsule Take 2 capsules (200 mg total) by mouth every 8 (eight) hours. 07/29/24  Yes Bernardino Ditch, NP  ipratropium (ATROVENT ) 0.06 % nasal spray Place 2 sprays into both nostrils 4 (four) times daily. 07/29/24  Yes Bernardino Ditch, NP  methylphenidate  (RITALIN ) 20 MG tablet Take 20 mg by mouth 2 (  two) times daily. 07/23/24  Yes [provider]  promethazine -dextromethorphan (PROMETHAZINE -DM) 6.25-15 MG/5ML syrup Take 5 mLs by mouth 4 (four) times daily as needed. 07/29/24  Yes Bernardino Ditch, NP  Spacer/Aero-Holding Chambers (AEROCHAMBER MV) inhaler Use as instructed 07/29/24  Yes Bernardino Ditch, NP  atorvastatin  (LIPITOR) 20 MG tablet Take 1 tablet (20 mg total) by mouth daily. 04/17/24   Rilla Baller, MD  AZSTARYS 52.3-10.4 MG CAPS Take 1 capsule by mouth daily. 04/14/24   [provider]  buPROPion  (WELLBUTRIN  XL) 300 MG 24 hr tablet Take 1 tablet (300 mg total) by mouth daily. 01/30/23   Rilla Baller, MD  Cholecalciferol (VITAMIN D3) 50 MCG (2000 UT) capsule Take 2,000 Units by mouth daily.    [provider]  cloNIDine HCl (KAPVAY) 0.1 MG TB12 ER tablet Take 0.1 mg by mouth 2 (two) times daily. 03/19/24   [provider]  desvenlafaxine (PRISTIQ) 50 MG 24 hr tablet     [provider]  lisinopril  (ZESTRIL ) 10 MG tablet Take 1 tablet (10 mg total) by mouth daily. 04/17/24   Rilla Baller, MD    Family History Family History  Problem Relation Age of Onset   Hyperlipidemia Mother    Heart disease Mother    Hypertension Mother    Alzheimer's disease Mother    COPD Mother    Hyperlipidemia Father    Hypertension Father    Mitochondrial disorder Brother        Mitochondrial encephalomyopathy lactic acidosis   Breast cancer Paternal Aunt    Arthritis Maternal Grandmother     Social History Social History   Tobacco Use   Smoking status: Never   Smokeless tobacco: Never  Vaping Use   Vaping status: Never Used  Substance Use Topics   Alcohol use: Yes    Alcohol/week: 0.0 standard drinks of alcohol    Comment: occasional - 1x/mo   Drug use: No     Allergies   Patient has no known allergies.   Review of Systems Review of Systems  Constitutional:  Positive for fatigue. Negative for fever.  HENT:  Positive for congestion, postnasal drip and sore throat. Negative for ear pain.   Respiratory:  Positive for cough and shortness of breath. Negative for wheezing.   Gastrointestinal:  Positive for nausea. Negative for diarrhea and vomiting.  Neurological:  Positive for headaches.     Physical Exam Triage Vital Signs ED Triage Vitals [07/29/24 1813]  Encounter Vitals Group     BP      Girls Systolic BP Percentile      Girls  Diastolic BP Percentile      Boys Systolic BP Percentile      Boys Diastolic BP Percentile      Pulse      Resp 16     Temp      Temp Source Oral     SpO2      Weight      Height      Head Circumference      Peak Flow      Pain Score      Pain Loc      Pain Education      Exclude from Growth Chart    No data found.  Updated Vital Signs BP 134/88 (BP Location: Left Arm)   Pulse 80   Temp 98.7 F (37.1 C) (Oral)   Resp 16   Ht 5' 5 (1.651 m)   Wt 200 lb 12.8 oz (91.1  kg)   LMP  (LMP Unknown)   SpO2 100%   BMI 33.41 kg/m   Visual Acuity Right Eye Distance:   Left Eye Distance:   Bilateral Distance:    Right Eye Near:   Left Eye Near:    Bilateral Near:     Physical Exam Vitals and nursing note reviewed.  Constitutional:      Appearance: Normal appearance. She is not ill-appearing.  HENT:     Head: Normocephalic and atraumatic.     Right Ear: Tympanic membrane, ear canal and external ear normal. There is no impacted cerumen.     Left Ear: Tympanic membrane, ear canal and external ear normal. There is no impacted cerumen.     Nose: Congestion and rhinorrhea present.     Comments: Please mucosa is erythematous, moderately edematous, with scant clear discharge in both nares.    Mouth/Throat:     Mouth: Mucous membranes are moist.     Pharynx: Oropharynx is clear. Posterior oropharyngeal erythema present. No oropharyngeal exudate.     Comments: Tonsillar pillars are unremarkable.  Posterior oropharynx demonstrates erythema and clear postnasal drip. Cardiovascular:     Rate and Rhythm: Normal rate and regular rhythm.     Pulses: Normal pulses.     Heart sounds: Normal heart sounds. No murmur heard.    No friction rub. No gallop.  Pulmonary:     Effort: Pulmonary effort is normal.     Breath sounds: Normal breath sounds. No wheezing, rhonchi or rales.  Musculoskeletal:     Cervical back: Normal range of motion and neck supple. No tenderness.  Lymphadenopathy:      Cervical: No cervical adenopathy.  Skin:    General: Skin is warm and dry.     Capillary Refill: Capillary refill takes less than 2 seconds.     Findings: No rash.  Neurological:     General: No focal deficit present.     Mental Status: She is alert and oriented to person, place, and time.      UC Treatments / Results  Labs (all labs ordered are listed, but only abnormal results are displayed) Labs Reviewed  GROUP A STREP BY PCR    EKG   Radiology No results found.  Procedures Procedures (including critical care time)  Medications Ordered in UC Medications - No data to display  Initial Impression / Assessment and Plan / UC Course  I have reviewed the triage vital signs and the nursing notes.  Pertinent labs & imaging results that were available during my care of the patient were reviewed by me and considered in my medical decision making (see chart for details).   Patient is a nontoxic-appearing 53 year old female presenting for evaluation of 10 days with a respiratory symptoms as outlined HPI above.  She denies any history of asthma or COPD.  She reports that she started develop shortness of breath yesterday and initially it was only with exertion and now it can happen at rest.  In the exam room she is able to speak in full sentences without dyspnea or tachypnea.  Respiratory rate is 16 with a room air oxygen saturation of 100%.  She is afebrile with a normal temp of 98.7.  The remainder of her physical exam does reveal inflammation of her upper respiratory tract as evidenced by inflamed nasal mucosa with scant clear nasal discharge.  Also erythema to the posterior oropharynx with clear postnasal drip.  No cervical lymphadenopathy present.  Differential diagnose include COVID, influenza, strep  pharyngitis.  I had ordered a strep PCR but the patient declines at this time stating she does not think she has strep.  I will discharge her home with a diagnosis of URI with cough  and congestion.  Given that she has had symptoms for 10 days I do feel antibiotics are warranted and I will start her on Augmentin  875 twice daily for 10 days to cover for potential strep.  I will also prescribe Atrovent  nasal spray to help with nasal congestion.  Tessalon  Perles are present and positive for cough and congestion.  I will also prescribe an albuterol  inhaler and spacer and she can take 1 to 2 puffs every 4-6 hours as needed for any shortness of breath.  Return precautions reviewed.   Final Clinical Impressions(s) / UC Diagnoses   Final diagnoses:  URI with cough and congestion     Discharge Instructions      Take the Augmentin  875 mg twice daily with food for 10 days for treatment of your upper respiratory tract infection.  Use over-the-counter Tylenol  and/or ibuprofen according the package directions as needed for any fever or pain.  Use the albuterol  inhaler, with the spacer, and take 1 to 2 puffs every 4-6 hours as needed for any shortness of breath or if you develop wheezing.  Use the Atrovent  nasal spray, 2 squirts in each nostril every 6 hours, as needed for runny nose and postnasal drip.  Use the Tessalon  Perles every 8 hours during the day.  Take them with a small sip of water .  They may give you some numbness to the base of your tongue or a metallic taste in your mouth, this is normal.  Use the Promethazine  DM cough syrup at bedtime for cough and congestion.  It will make you drowsy so do not take it during the day.  Return for reevaluation or see your primary care provider for any new or worsening symptoms.      ED Prescriptions     Medication Sig Dispense Auth. Provider   albuterol  (VENTOLIN  HFA) 108 (90 Base) MCG/ACT inhaler Inhale 2 puffs into the lungs every 4 (four) hours as needed. 18 g Bernardino Ditch, NP   Spacer/Aero-Holding Chambers (AEROCHAMBER MV) inhaler Use as instructed 1 each Bernardino Ditch, NP   benzonatate  (TESSALON ) 100 MG capsule Take 2  capsules (200 mg total) by mouth every 8 (eight) hours. 21 capsule Bernardino Ditch, NP   ipratropium (ATROVENT ) 0.06 % nasal spray Place 2 sprays into both nostrils 4 (four) times daily. 15 mL Bernardino Ditch, NP   promethazine -dextromethorphan (PROMETHAZINE -DM) 6.25-15 MG/5ML syrup Take 5 mLs by mouth 4 (four) times daily as needed. 118 mL Bernardino Ditch, NP   amoxicillin -clavulanate (AUGMENTIN ) 875-125 MG tablet Take 1 tablet by mouth every 12 (twelve) hours for 10 days. 20 tablet Bernardino Ditch, NP      PDMP not reviewed this encounter.   Bernardino Ditch, NP 07/29/24 [phone]

## 2024-07-29 NOTE — ED Triage Notes (Signed)
 Pt c/o HA,sore throat,fatigue & sob x2 wks. Denies any hx of asthma or COPD. Has tried OTC meds w/o relief.

## 2024-07-29 NOTE — Discharge Instructions (Addendum)
 Take the Augmentin  875 mg twice daily with food for 10 days for treatment of your upper respiratory tract infection.  Use over-the-counter Tylenol  and/or ibuprofen according the package directions as needed for any fever or pain.  Use the albuterol  inhaler, with the spacer, and take 1 to 2 puffs every 4-6 hours as needed for any shortness of breath or if you develop wheezing.  Use the Atrovent  nasal spray, 2 squirts in each nostril every 6 hours, as needed for runny nose and postnasal drip.  Use the Tessalon  Perles every 8 hours during the day.  Take them with a small sip of water .  They may give you some numbness to the base of your tongue or a metallic taste in your mouth, this is normal.  Use the Promethazine  DM cough syrup at bedtime for cough and congestion.  It will make you drowsy so do not take it during the day.  Return for reevaluation or see your primary care provider for any new or worsening symptoms.
